# Patient Record
Sex: Male | Born: 1935 | Race: White | Hispanic: No | Marital: Married | Smoking: Former smoker
Health system: Southern US, Community
[De-identification: ages and names within clinical notes are randomized; demographics above are authoritative.]

## PROBLEM LIST (undated history)

## (undated) DIAGNOSIS — F329 Major depressive disorder, single episode, unspecified: Secondary | ICD-10-CM

## (undated) DIAGNOSIS — F32A Depression, unspecified: Secondary | ICD-10-CM

## (undated) DIAGNOSIS — M4800 Spinal stenosis, site unspecified: Secondary | ICD-10-CM

## (undated) DIAGNOSIS — I10 Essential (primary) hypertension: Secondary | ICD-10-CM

## (undated) DIAGNOSIS — C189 Malignant neoplasm of colon, unspecified: Secondary | ICD-10-CM

## (undated) DIAGNOSIS — K219 Gastro-esophageal reflux disease without esophagitis: Secondary | ICD-10-CM

## (undated) HISTORY — PX: TONSILLECTOMY: SUR1361

## (undated) HISTORY — PX: CHOLECYSTECTOMY: SHX55

---

## 2016-01-04 ENCOUNTER — Encounter (HOSPITAL_COMMUNITY): Payer: Self-pay | Admitting: *Deleted

## 2016-01-04 ENCOUNTER — Emergency Department (HOSPITAL_COMMUNITY)
Admission: EM | Admit: 2016-01-04 | Discharge: 2016-01-04 | Disposition: A | Discharge status: Home / Self Care | Payer: Medicare Other | Attending: Emergency Medicine | Admitting: Emergency Medicine

## 2016-01-04 DIAGNOSIS — Y939 Activity, unspecified: Secondary | ICD-10-CM | POA: Insufficient documentation

## 2016-01-04 DIAGNOSIS — S3992XA Unspecified injury of lower back, initial encounter: Secondary | ICD-10-CM | POA: Diagnosis present

## 2016-01-04 DIAGNOSIS — Z87891 Personal history of nicotine dependence: Secondary | ICD-10-CM | POA: Insufficient documentation

## 2016-01-04 DIAGNOSIS — W19XXXA Unspecified fall, initial encounter: Secondary | ICD-10-CM

## 2016-01-04 DIAGNOSIS — Y9289 Other specified places as the place of occurrence of the external cause: Secondary | ICD-10-CM | POA: Insufficient documentation

## 2016-01-04 DIAGNOSIS — I10 Essential (primary) hypertension: Secondary | ICD-10-CM | POA: Insufficient documentation

## 2016-01-04 DIAGNOSIS — S300XXA Contusion of lower back and pelvis, initial encounter: Secondary | ICD-10-CM | POA: Insufficient documentation

## 2016-01-04 DIAGNOSIS — Z79899 Other long term (current) drug therapy: Secondary | ICD-10-CM | POA: Insufficient documentation

## 2016-01-04 DIAGNOSIS — Y999 Unspecified external cause status: Secondary | ICD-10-CM | POA: Diagnosis not present

## 2016-01-04 DIAGNOSIS — W06XXXA Fall from bed, initial encounter: Secondary | ICD-10-CM | POA: Diagnosis not present

## 2016-01-04 DIAGNOSIS — Z85038 Personal history of other malignant neoplasm of large intestine: Secondary | ICD-10-CM | POA: Diagnosis not present

## 2016-01-04 HISTORY — DX: Essential (primary) hypertension: I10

## 2016-01-04 HISTORY — DX: Depression, unspecified: F32.A

## 2016-01-04 HISTORY — DX: Major depressive disorder, single episode, unspecified: F32.9

## 2016-01-04 HISTORY — DX: Malignant neoplasm of colon, unspecified: C18.9

## 2016-01-04 HISTORY — DX: Gastro-esophageal reflux disease without esophagitis: K21.9

## 2016-01-04 HISTORY — DX: Spinal stenosis, site unspecified: M48.00

## 2016-01-04 MED ORDER — ACETAMINOPHEN 500 MG PO TABS: 500.0000 mg | ORAL_TABLET | Freq: Four times a day (QID) | ORAL | Status: DC | PRN

## 2016-01-04 NOTE — ED Provider Notes (Addendum)
CSN: [id number]     Arrival date & time 01/04/16  [id number] History   First MD Initiated Contact with Patient 01/04/16 [phone]     Chief Complaint  Patient presents with  . Fall     (Consider location/radiation/quality/duration/timing/severity/associated sxs/prior Treatment) Patient is a 82 y.o. male presenting with fall. The history is provided by the patient.  Fall This is a new (pt slipped off the side of the bed today landing on the floor on his right side) problem. The current episode started 1 to 2 hours ago. The problem occurs constantly. The problem has been resolved. Associated symptoms comments: Minimal right sided back and buttocks pain. Nothing aggravates the symptoms. Nothing relieves the symptoms. He has tried nothing for the symptoms. The treatment provided significant relief.    Past Medical History  Diagnosis Date  . Hypertension   . GERD (gastroesophageal reflux disease)   . Depressed   . Colon cancer ([place])   . Spinal stenosis    Past Surgical History  Procedure Laterality Date  . Cholecystectomy    . Tonsillectomy     History reviewed. No pertinent family history. Social History  Substance Use Topics  . Smoking status: Former Research scientist (life sciences)  . Smokeless tobacco: None  . Alcohol Use: No    Review of Systems  All other systems reviewed and are negative.     Allergies  Review of patient's allergies indicates no known allergies.  Home Medications   Prior to Admission medications   Medication Sig Start Date End Date Taking? Authorizing Provider  albuterol (PROVENTIL) (2.5 MG/3ML) 0.083% nebulizer solution Take 2.5 mg by nebulization every 6 (six) hours as needed for wheezing or shortness of breath.   Yes Historical Provider, MD  albuterol (PROVENTIL) (5 MG/ML) 0.5% nebulizer solution Take 2.5 mg by nebulization every 6 (six) hours as needed for wheezing or shortness of breath.   Yes Historical Provider, MD  lisinopril-hydrochlorothiazide (PRINZIDE,ZESTORETIC) 20-12.5  MG tablet Take 1 tablet by mouth daily.   Yes Historical Provider, MD  omeprazole (PRILOSEC) 20 MG capsule Take 20 mg by mouth daily.   Yes Historical Provider, MD   There were no vitals taken for this visit. Physical Exam  Constitutional: He is oriented to person, place, and time. He appears well-developed and well-nourished. No distress.  HENT:  Head: Normocephalic and atraumatic.  Mouth/Throat: Oropharynx is clear and moist.  Eyes: Conjunctivae and EOM are normal. Pupils are equal, round, and reactive to light.  Neck: Normal range of motion. Neck supple. No spinous process tenderness and no muscular tenderness present.  Cardiovascular: Normal rate, regular rhythm and intact distal pulses.   No murmur heard. Pulmonary/Chest: Effort normal and breath sounds normal. No respiratory distress. He has no wheezes. He has no rales.  Abdominal: Soft. He exhibits no distension. There is no tenderness. There is no rebound and no guarding.  Musculoskeletal: Normal range of motion. He exhibits tenderness. He exhibits no edema.       Back:  Neurological: He is alert and oriented to person, place, and time. He has normal strength. No sensory deficit. Gait normal.  Able to walk independently without difficulty  Skin: Skin is warm and dry. No rash noted. No erythema.  Psychiatric: He has a normal mood and affect. His behavior is normal.  Nursing note and vitals reviewed.   ED Course  Procedures (including critical care time) Labs Review Labs Reviewed - No data to display  Imaging Review No results found. I have personally reviewed and evaluated  these images and lab results as part of my medical decision-making.   EKG Interpretation None      MDM   Final diagnoses:  Fall, initial encounter  Contusion of buttock, initial encounter    Patient is a 82 year old male presenting from a nursing home after a fall today. Patient states he slipped out of the bed and landed on his right side. He  states he has mild right back pain but no other complaints. He is able to ambulate in the room without difficulty. No reproducible pain. He denies any head injury or LOC. He is not on anticoagulation. I'm no need for further evaluation. Patient was discharged back to the nursing facility.    Blanchie Dessert, MD 01/04/16 [place], MD 01/04/16 1008

## 2016-01-04 NOTE — Discharge Instructions (Signed)

## 2016-01-04 NOTE — ED Notes (Signed)
[facility] called for transport to [facility]

## 2016-01-04 NOTE — ED Notes (Signed)
Patient is resting comfortably. 

## 2016-01-04 NOTE — ED Notes (Addendum)
Brought in by ems from [facility] for fall x  Days ago ,denies complaints, chronic back and  hip pain

## 2016-01-04 NOTE — ED Notes (Signed)
Bed: WA02 Expected date: 01/04/16 Expected time: 9:47 AM Means of arrival:  Comments: Fall from nsg home, denies injury

## 2016-03-14 ENCOUNTER — Emergency Department (HOSPITAL_COMMUNITY): Payer: Medicare Other

## 2016-03-14 ENCOUNTER — Encounter (HOSPITAL_COMMUNITY): Payer: Self-pay | Admitting: *Deleted

## 2016-03-14 ENCOUNTER — Emergency Department (HOSPITAL_COMMUNITY)
Admission: EM | Admit: 2016-03-14 | Discharge: 2016-03-14 | Disposition: A | Discharge status: Home / Self Care | Payer: Medicare Other | Attending: Emergency Medicine | Admitting: Emergency Medicine

## 2016-03-14 DIAGNOSIS — Y999 Unspecified external cause status: Secondary | ICD-10-CM | POA: Diagnosis not present

## 2016-03-14 DIAGNOSIS — Z79899 Other long term (current) drug therapy: Secondary | ICD-10-CM | POA: Diagnosis not present

## 2016-03-14 DIAGNOSIS — W19XXXA Unspecified fall, initial encounter: Secondary | ICD-10-CM | POA: Diagnosis not present

## 2016-03-14 DIAGNOSIS — Z87891 Personal history of nicotine dependence: Secondary | ICD-10-CM | POA: Diagnosis not present

## 2016-03-14 DIAGNOSIS — Y939 Activity, unspecified: Secondary | ICD-10-CM | POA: Diagnosis not present

## 2016-03-14 DIAGNOSIS — Y929 Unspecified place or not applicable: Secondary | ICD-10-CM | POA: Insufficient documentation

## 2016-03-14 DIAGNOSIS — I10 Essential (primary) hypertension: Secondary | ICD-10-CM | POA: Insufficient documentation

## 2016-03-14 DIAGNOSIS — M542 Cervicalgia: Secondary | ICD-10-CM | POA: Insufficient documentation

## 2016-03-14 MED ORDER — ACETAMINOPHEN 500 MG PO TABS: 500.0000 mg | ORAL_TABLET | Freq: Four times a day (QID) | ORAL | 0 refills | Status: AC | PRN

## 2016-03-14 MED ORDER — ACETAMINOPHEN 500 MG PO TABS
1000.0000 mg | ORAL_TABLET | Freq: Once | ORAL | Status: AC
  Administered 2016-03-14: 1000 mg via ORAL
  Filled 2016-03-14: qty 2

## 2016-03-14 NOTE — ED Provider Notes (Signed)
[facility] DEPT Provider Note   CSN: [id number] Arrival date & time: 03/14/16  1836  First Provider Contact:  First MD Initiated Contact with Patient 03/14/16 1850        History   Chief Complaint Chief Complaint  Patient presents with  . Neck Pain    HPI Franklin Foster is a 82 y.o. male.  Patient unable to describe any onset or modifying factors of symptoms. He states only that he has neck pain and that is why he is here. He is a very poor historian.  Level 5 exception to history: dementia    The history is provided by the patient.    Past Medical History:  Diagnosis Date  . Colon cancer ([place])   . Depressed   . GERD (gastroesophageal reflux disease)   . Hypertension   . Spinal stenosis     There are no active problems to display for this patient.   Past Surgical History:  Procedure Laterality Date  . CHOLECYSTECTOMY    . TONSILLECTOMY         Home Medications    Prior to Admission medications   Medication Sig Start Date End Date Taking? Authorizing Provider  acetaminophen (TYLENOL) 500 MG tablet Take 1 tablet (500 mg total) by mouth every 6 (six) hours as needed for moderate pain. 01/04/16  Yes Blanchie Dessert, MD  albuterol (PROVENTIL HFA;VENTOLIN HFA) 108 (90 Base) MCG/ACT inhaler Inhale 1 puff into the lungs every 4 (four) hours as needed for wheezing or shortness of breath.   Yes Historical Provider, MD  ipratropium-albuterol (DUONEB) 0.5-2.5 (3) MG/3ML SOLN Take 3 mLs by nebulization 2 (two) times daily as needed (wheezing/shortness of breath).   Yes Historical Provider, MD  lisinopril-hydrochlorothiazide (PRINZIDE,ZESTORETIC) 20-12.5 MG tablet Take 1 tablet by mouth daily.   Yes Historical Provider, MD  Multiple Vitamins-Minerals (THEREMS-M PO) Take 1 tablet by mouth daily.   Yes Historical Provider, MD  omeprazole (PRILOSEC) 20 MG capsule Take 20 mg by mouth daily.   Yes Historical Provider, MD  Tetrahydrozoline HCl (EYE DROPS OP) Apply 1 drop to eye  4 (four) times daily. "Artificial Saliva Solution"   Yes Historical Provider, MD    Family History No family history on file.  Social History Social History  Substance Use Topics  . Smoking status: Former Research scientist (life sciences)  . Smokeless tobacco: Never Used  . Alcohol use No     Allergies   Review of patient's allergies indicates no known allergies.   Review of Systems Review of Systems  Unable to perform ROS: Age     Physical Exam Updated Vital Signs BP 151/59 (BP Location: Right Arm)   Pulse 78   Temp 99.1 F (37.3 C) (Oral)   Resp 16   SpO2 98%   Physical Exam  Constitutional: He appears well-developed and well-nourished. No distress.  HENT:  Head: Normocephalic and atraumatic.  Eyes: Conjunctivae are normal.  Neck: Trachea normal. Neck supple. Muscular tenderness (of right side of neck) present. No spinous process tenderness present. No neck rigidity. No tracheal deviation and normal range of motion present.    Cardiovascular: Normal rate and regular rhythm.   Pulmonary/Chest: Effort normal. No respiratory distress.  Abdominal: Soft. He exhibits no distension.  Neurological: He is alert. He is disoriented (pleasantly).  Skin: Skin is warm and dry.  Psychiatric: He has a normal mood and affect.     ED Treatments / Results  Labs (all labs ordered are listed, but only abnormal results are displayed) Labs Reviewed -  No data to display  EKG  EKG Interpretation None       Radiology Dg Cervical Spine Complete  Result Date: 03/14/2016 CLINICAL DATA:  82 year old post fall yesterday. Right-sided and posterior cervical neck pain since fall. EXAM: CERVICAL SPINE - COMPLETE 4+ VIEW COMPARISON:  None. FINDINGS: No prior exams available for comparison. There is reversal and straightening of normal lordosis. No evidence of listhesis. Disc space narrowing throughout cervical spine with endplate spurring, most prominent C4 through C7. Multilevel facet arthropathy. Lateral  masses of C1 are well aligned on C2. No radiographic evidence of fracture. No prevertebral soft tissue edema. IMPRESSION: Multilevel degenerative disc disease and facet arthropathy with reversal of normal cervical lordosis. No radiographic evidence of acute fracture or subluxation. If there is persistent clinical concern for fracture, recommend CT. Electronically Signed   By: Jeb Levering M.D.   On: 03/14/2016 19:55    Procedures Procedures (including critical care time)  Medications Ordered in ED Medications - No data to display   Initial Impression / Assessment and Plan / ED Course  I have reviewed the triage vital signs and the nursing notes.  Pertinent labs & imaging results that were available during my care of the patient were reviewed by me and considered in my medical decision making (see chart for details).  Clinical Course    82 year old male presents with neck pain. He is a poor historian and the report from the nursing facility is that he may have tooth pain. The patient is unsure and thinks he might of falling yesterday but he is unable to tell me the year or the month or what city he is in. He has no evidence of trauma to the scalp. Minimal tenderness in right side of the neck. Screening x-ray for bony injury to the neck was performed and is negative. The patient is moving everything appropriately and has full sensation throughout his upper extremities and lower extremities. He is given Tylenol for his pain with improvement. Plan for follow-up with primary care physician and return precautions for new or concerning symptoms.  Final Clinical Impressions(s) / ED Diagnoses   Final diagnoses:  Neck pain    New Prescriptions New Prescriptions   No medications on file     [name], MD 03/14/16 2038

## 2016-03-14 NOTE — ED Triage Notes (Signed)
Per EMS, pt complains of right sided neck pain for the past 2 days. Pt denies injury/fall. Pt states the pain is on the same side as a rotten tooth.   Pt is from [facility].

## 2016-03-14 NOTE — ED Notes (Signed)
Bed: [facility] Expected date:  Expected time:  Means of arrival:  Comments: 

## 2016-03-14 NOTE — ED Notes (Signed)
No respiratory or acute distress noted alert and talking urinal given for pt to use.

## 2016-03-14 NOTE — ED Notes (Signed)
Called dispatch and patient is still on the list. Waiting on PTAR.

## 2016-11-27 ENCOUNTER — Encounter (HOSPITAL_COMMUNITY): Payer: Self-pay | Admitting: Radiology

## 2016-11-27 ENCOUNTER — Emergency Department (HOSPITAL_COMMUNITY): Payer: Medicare Other

## 2016-11-27 ENCOUNTER — Observation Stay (HOSPITAL_COMMUNITY)
Admission: EM | Admit: 2016-11-27 | Discharge: 2016-11-29 | Disposition: A | Discharge status: Nursing Home (Includes ICF) | Payer: Medicare Other | Attending: Family Medicine | Admitting: Family Medicine

## 2016-11-27 DIAGNOSIS — R270 Ataxia, unspecified: Secondary | ICD-10-CM

## 2016-11-27 DIAGNOSIS — Y9301 Activity, walking, marching and hiking: Secondary | ICD-10-CM | POA: Diagnosis not present

## 2016-11-27 DIAGNOSIS — E860 Dehydration: Secondary | ICD-10-CM | POA: Diagnosis present

## 2016-11-27 DIAGNOSIS — R262 Difficulty in walking, not elsewhere classified: Secondary | ICD-10-CM

## 2016-11-27 DIAGNOSIS — I10 Essential (primary) hypertension: Secondary | ICD-10-CM | POA: Diagnosis not present

## 2016-11-27 DIAGNOSIS — Z85038 Personal history of other malignant neoplasm of large intestine: Secondary | ICD-10-CM | POA: Insufficient documentation

## 2016-11-27 DIAGNOSIS — F0390 Unspecified dementia without behavioral disturbance: Secondary | ICD-10-CM | POA: Diagnosis not present

## 2016-11-27 DIAGNOSIS — S0003XA Contusion of scalp, initial encounter: Secondary | ICD-10-CM | POA: Diagnosis not present

## 2016-11-27 DIAGNOSIS — R55 Syncope and collapse: Principal | ICD-10-CM | POA: Diagnosis present

## 2016-11-27 DIAGNOSIS — S4992XA Unspecified injury of left shoulder and upper arm, initial encounter: Secondary | ICD-10-CM | POA: Diagnosis present

## 2016-11-27 DIAGNOSIS — M25511 Pain in right shoulder: Secondary | ICD-10-CM | POA: Diagnosis not present

## 2016-11-27 DIAGNOSIS — W1830XA Fall on same level, unspecified, initial encounter: Secondary | ICD-10-CM | POA: Diagnosis not present

## 2016-11-27 DIAGNOSIS — Z7951 Long term (current) use of inhaled steroids: Secondary | ICD-10-CM | POA: Insufficient documentation

## 2016-11-27 DIAGNOSIS — M25512 Pain in left shoulder: Secondary | ICD-10-CM | POA: Diagnosis not present

## 2016-11-27 DIAGNOSIS — K219 Gastro-esophageal reflux disease without esophagitis: Secondary | ICD-10-CM | POA: Diagnosis not present

## 2016-11-27 DIAGNOSIS — Z79899 Other long term (current) drug therapy: Secondary | ICD-10-CM | POA: Insufficient documentation

## 2016-11-27 DIAGNOSIS — Y92099 Unspecified place in other non-institutional residence as the place of occurrence of the external cause: Secondary | ICD-10-CM | POA: Insufficient documentation

## 2016-11-27 DIAGNOSIS — G459 Transient cerebral ischemic attack, unspecified: Secondary | ICD-10-CM

## 2016-11-27 DIAGNOSIS — R1311 Dysphagia, oral phase: Secondary | ICD-10-CM | POA: Diagnosis not present

## 2016-11-27 DIAGNOSIS — R4781 Slurred speech: Secondary | ICD-10-CM | POA: Insufficient documentation

## 2016-11-27 DIAGNOSIS — Z87891 Personal history of nicotine dependence: Secondary | ICD-10-CM | POA: Insufficient documentation

## 2016-11-27 LAB — BASIC METABOLIC PANEL
ANION GAP: 8 (ref 5–15)
BUN: 20 mg/dL (ref 6–20)
CALCIUM: 8.8 mg/dL — AB (ref 8.9–10.3)
CO2: 25 mmol/L (ref 22–32)
Chloride: 99 mmol/L — ABNORMAL LOW (ref 101–111)
Creatinine, Ser: 0.97 mg/dL (ref 0.61–1.24)
GFR calc Af Amer: >60 mL/min (ref >60)
Glucose, Bld: 102 mg/dL — ABNORMAL HIGH (ref 65–99)
POTASSIUM: 4.1 mmol/L (ref 3.5–5.1)
SODIUM: 132 mmol/L — AB (ref 135–145)

## 2016-11-27 LAB — URINALYSIS, ROUTINE W REFLEX MICROSCOPIC
BILIRUBIN URINE: NEGATIVE
Glucose, UA: NEGATIVE mg/dL
Hgb urine dipstick: NEGATIVE
KETONES UR: NEGATIVE mg/dL
LEUKOCYTES UA: NEGATIVE
NITRITE: NEGATIVE
PH: 5 (ref 5.0–8.0)
Protein, ur: NEGATIVE mg/dL
Specific Gravity, Urine: 1.01 (ref 1.005–1.030)

## 2016-11-27 LAB — CBC
HEMATOCRIT: 35.8 % — AB (ref 39.0–52.0)
HEMOGLOBIN: 12.3 g/dL — AB (ref 13.0–17.0)
MCH: 33.1 pg (ref 26.0–34.0)
MCHC: 34.4 g/dL (ref 30.0–36.0)
MCV: 96.2 fL (ref 78.0–100.0)
Platelets: 219 10*3/uL (ref 150–400)
RBC: 3.72 MIL/uL — ABNORMAL LOW (ref 4.22–5.81)
RDW: 14.6 % (ref 11.5–15.5)
WBC: 7.4 10*3/uL (ref 4.0–10.5)

## 2016-11-27 LAB — CBG MONITORING, ED: GLUCOSE-CAPILLARY: 120 mg/dL — AB (ref 65–99)

## 2016-11-27 LAB — TROPONIN I: Troponin I: <0.03 ng/mL (ref <0.03)

## 2016-11-27 MED ORDER — IPRATROPIUM-ALBUTEROL 0.5-2.5 (3) MG/3ML IN SOLN
3.0000 mL | Freq: Once | RESPIRATORY_TRACT | Status: AC
  Administered 2016-11-27: 3 mL via RESPIRATORY_TRACT
  Filled 2016-11-27: qty 3

## 2016-11-27 MED ORDER — SODIUM CHLORIDE 0.9 % IV SOLN
Freq: Once | INTRAVENOUS | Status: AC
  Administered 2016-11-27: 22:00:00 via INTRAVENOUS

## 2016-11-27 MED ORDER — SODIUM CHLORIDE 0.9 % IV BOLUS (SEPSIS)
500.0000 mL | Freq: Once | INTRAVENOUS | Status: AC
  Administered 2016-11-27: 500 mL via INTRAVENOUS

## 2016-11-27 MED ORDER — SODIUM CHLORIDE 0.9 % IV BOLUS (SEPSIS): 500.0000 mL | Freq: Once | INTRAVENOUS | Status: DC

## 2016-11-27 NOTE — ED Notes (Addendum)
Patient transported to CT 

## 2016-11-27 NOTE — ED Notes (Signed)
ED Provider at bedside. 

## 2016-11-27 NOTE — ED Triage Notes (Signed)
Per [facility]- Pt resides at [facility]. FULL CODE. Witnessed fall. ? Dizziness. No LOC. Denies neck and back pain however BIL shoulder pain. [facility] cleared. Contusion to occipital area. No other trauma noted. Amu bates with walker at baseline. Pt states he felt "shaky" however unsure if this is his baseline.Pt has HX of slurred speech. Staff states baseline.

## 2016-11-27 NOTE — ED Notes (Signed)
EKG PERFORMED.

## 2016-11-27 NOTE — ED Provider Notes (Signed)
[facility] DEPT Provider Note   CSN: [id number] Arrival date & time: 11/27/16  1339     History   Chief Complaint Chief Complaint  Patient presents with  . Fall  . Shoulder Injury  . Dizziness    HPI Franklin Foster is a 83 y.o. male.  HPI   83 yo M with PMHx colon CA, HTN, dementia here with fall. Pt reportedly was at his baseline state of health until this afternoon. Pt reportedly stood up then immediately fell backwards, hitting his head and back. He did not lose consciousness. Was unable to get up until EMS arrival. With EMS, pt reported that he was "very wobbly" when they stood him up but was able to walk to stretcher with assistance. No blood thinner use. He currently c/o mild aching b/l shoulder pain that is aching and throbbing. No alleviating factors. No headache. No vision changes. No abdominal pain, nausea, vomiting.   Level 5 caveat invoked as remainder of history, ROS, and physical exam limited due to patient's dementia  Past Medical History:  Diagnosis Date  . Colon cancer ([place])   . Depressed   . GERD (gastroesophageal reflux disease)   . Hypertension   . Spinal stenosis     Patient Active Problem List   Diagnosis Date Noted  . Syncope 11/27/2016  . Dehydration 11/27/2016  . Essential hypertension 11/27/2016  . Ataxia 11/27/2016    Past Surgical History:  Procedure Laterality Date  . CHOLECYSTECTOMY    . TONSILLECTOMY         Home Medications    Prior to Admission medications   Medication Sig Start Date End Date Taking? Authorizing Provider  ipratropium-albuterol (DUONEB) 0.5-2.5 (3) MG/3ML SOLN Take 3 mLs by nebulization 2 (two) times daily.    Yes Historical Provider, MD  lisinopril-hydrochlorothiazide (PRINZIDE,ZESTORETIC) 10-12.5 MG tablet Take 1.5 tablets by mouth daily.   Yes Historical Provider, MD  Multiple Vitamins-Minerals (THEREMS-M PO) Take 1 tablet by mouth daily.   Yes Historical Provider, MD  omeprazole (PRILOSEC) 20 MG capsule  Take 20 mg by mouth daily.   Yes Historical Provider, MD  Tetrahydrozoline HCl (EYE DROPS OP) Apply 1 drop to eye 4 (four) times daily. "Artificial Saliva Solution"   Yes Historical Provider, MD  acetaminophen (TYLENOL) 500 MG tablet Take 1 tablet (500 mg total) by mouth every 6 (six) hours as needed for moderate pain. 03/14/16   [name], MD  albuterol (PROVENTIL HFA;VENTOLIN HFA) 108 (90 Base) MCG/ACT inhaler Inhale 1 puff into the lungs every 4 (four) hours as needed for wheezing or shortness of breath.    Historical Provider, MD    Family History Family History  Problem Relation Age of Onset  . Ovarian cancer Sister   . CAD Neg Hx     Social History Social History  Substance Use Topics  . Smoking status: Former Research scientist (life sciences)  . Smokeless tobacco: Never Used  . Alcohol use No     Allergies   Patient has no known allergies.   Review of Systems Review of Systems  Unable to perform ROS: Dementia  Constitutional: Positive for fatigue. Negative for chills and fever.  HENT: Negative for congestion and rhinorrhea.   Eyes: Negative for visual disturbance.  Respiratory: Negative for cough, shortness of breath and wheezing.   Cardiovascular: Negative for chest pain and leg swelling.  Gastrointestinal: Negative for abdominal pain, diarrhea, nausea and vomiting.  Genitourinary: Negative for dysuria and flank pain.  Musculoskeletal: Positive for gait problem. Negative for neck pain  and neck stiffness.  Skin: Positive for wound. Negative for rash.  Allergic/Immunologic: Negative for immunocompromised state.  Neurological: Negative for syncope, weakness and headaches.  All other systems reviewed and are negative.    Physical Exam Updated Vital Signs BP 131/70 (BP Location: Right Arm)   Pulse 63   Temp 98 F (36.7 C) (Oral)   Resp 18   Ht 6' (1.829 m)   Wt 157 lb 12.8 oz (71.6 kg)   SpO2 100%   BMI 21.40 kg/m   Physical Exam  Constitutional: He appears well-developed and  well-nourished. No distress.  HENT:  Head: Normocephalic and atraumatic.  Eyes: Conjunctivae are normal.  Neck: Neck supple.  Moderate midline and paraspinal lower cervical and upper thoracic spine pain. No deformity or bruising.  Cardiovascular: Normal rate, regular rhythm and normal heart sounds.  Exam reveals no friction rub.   No murmur heard. Pulmonary/Chest: Effort normal and breath sounds normal. No respiratory distress. He has no wheezes. He has no rales.  Abdominal: He exhibits no distension.  Musculoskeletal: He exhibits no edema.  Neurological: He is alert. He exhibits normal muscle tone.  Oriented to person only. MAE with 5/5 strength. Follows commands. No gross tremor. Gait deferred. Speech slurred but fluent and coherent. Face is symmetric. Normal FTN.   Skin: Skin is warm. Capillary refill takes less than 2 seconds.  Psychiatric: He has a normal mood and affect.  Nursing note and vitals reviewed.    ED Treatments / Results  Labs (all labs ordered are listed, but only abnormal results are displayed) Labs Reviewed  BASIC METABOLIC PANEL - Abnormal; Notable for the following:       Result Value   Sodium 132 (*)    Chloride 99 (*)    Glucose, Bld 102 (*)    Calcium 8.8 (*)    All other components within normal limits  CBC - Abnormal; Notable for the following:    RBC 3.72 (*)    Hemoglobin 12.3 (*)    HCT 35.8 (*)    All other components within normal limits  CBG MONITORING, ED - Abnormal; Notable for the following:    Glucose-Capillary 120 (*)    All other components within normal limits  MRSA PCR SCREENING  URINALYSIS, ROUTINE W REFLEX MICROSCOPIC  TROPONIN I  TROPONIN I  HEMOGLOBIN A1C  LIPID PANEL    EKG  EKG Interpretation  Date/Time:  Friday November 27 2016 14:22:17 EDT Ventricular Rate:  62 PR Interval:    QRS Duration: 99 QT Interval:  415 QTC Calculation: 422 R Axis:   51 Text Interpretation:  Sinus or ectopic atrial rhythm Ventricular  premature complex Prolonged PR interval Abnormal R-wave progression, early transition No old tracing to compare Confirmed by Kyaire Gruenewald MD, Vernestine Brodhead [id number]) on 11/27/2016 2:33:37 PM       Radiology Dg Chest 2 View  Result Date: 11/27/2016 CLINICAL DATA:  Fall with back and shoulder pain. Initial encounter. EXAM: CHEST  2 VIEW COMPARISON:  None. FINDINGS: The cardiomediastinal silhouette is unremarkable. Mild interstitial prominence noted. There is no evidence of focal airspace disease, pulmonary edema, suspicious pulmonary nodule/mass, pleural effusion, or pneumothorax. No acute bony abnormalities are identified. IMPRESSION: No active cardiopulmonary disease. Electronically Signed   By: Margarette Canada M.D.   On: 11/27/2016 15:40   Dg Shoulder Right  Result Date: 11/27/2016 CLINICAL DATA:  Acute right shoulder pain following fall today. Initial encounter. EXAM: RIGHT SHOULDER - 2+ VIEW COMPARISON:  None. FINDINGS: There is no evidence  of fracture or dislocation. There is no evidence of arthropathy or other focal bone abnormality. Soft tissues are unremarkable. IMPRESSION: Negative. Electronically Signed   By: Margarette Canada M.D.   On: 11/27/2016 15:41   Ct Head Wo Contrast  Result Date: 11/27/2016 CLINICAL DATA:  83 year old male with head and neck injury following fall 2 days ago. Initial encounter. EXAM: CT HEAD WITHOUT CONTRAST CT CERVICAL SPINE WITHOUT CONTRAST TECHNIQUE: Multidetector CT imaging of the head and cervical spine was performed following the standard protocol without intravenous contrast. Multiplanar CT image reconstructions of the cervical spine were also generated. COMPARISON:  03/14/2016 cervical spine radiographs FINDINGS: CT HEAD FINDINGS Brain: No evidence of acute infarction, hemorrhage, hydrocephalus, extra-axial collection or mass lesion/mass effect. Atrophy, chronic small-vessel white matter ischemic changes and remote bilateral basal ganglia infarcts noted. Vascular: Intracranial  atherosclerotic calcifications noted. Skull: No acute abnormality.  Right frontoparietal craniotomy noted. Sinuses/Orbits: Opacified right maxillary sinus noted. No other significant abnormalities. Other: Posterior scalp soft tissue swelling identified. CT CERVICAL SPINE FINDINGS Alignment: Reversal of the normal cervical lordosis noted. Skull base and vertebrae: No acute fracture. No primary bone lesion or focal pathologic process. Soft tissues and spinal canal: No prevertebral fluid or swelling. No visible canal hematoma. Disc levels: Mild to moderate multilevel degenerative disc disease, spondylosis and facet arthropathy throughout the cervical spine noted, greatest from C3-C7. Upper chest: No acute abnormality Other: None IMPRESSION: No evidence of acute intracranial abnormality. Atrophy, chronic small-vessel white matter ischemic changes and remote basal ganglia infarcts. Posterior scalp soft tissue swelling without fracture. No static evidence of acute injury to the cervical spine. Degenerative changes as described. Electronically Signed   By: Margarette Canada M.D.   On: 11/27/2016 15:53   Ct Cervical Spine Wo Contrast  Result Date: 11/27/2016 CLINICAL DATA:  83 year old male with head and neck injury following fall 2 days ago. Initial encounter. EXAM: CT HEAD WITHOUT CONTRAST CT CERVICAL SPINE WITHOUT CONTRAST TECHNIQUE: Multidetector CT imaging of the head and cervical spine was performed following the standard protocol without intravenous contrast. Multiplanar CT image reconstructions of the cervical spine were also generated. COMPARISON:  03/14/2016 cervical spine radiographs FINDINGS: CT HEAD FINDINGS Brain: No evidence of acute infarction, hemorrhage, hydrocephalus, extra-axial collection or mass lesion/mass effect. Atrophy, chronic small-vessel white matter ischemic changes and remote bilateral basal ganglia infarcts noted. Vascular: Intracranial atherosclerotic calcifications noted. Skull: No acute  abnormality.  Right frontoparietal craniotomy noted. Sinuses/Orbits: Opacified right maxillary sinus noted. No other significant abnormalities. Other: Posterior scalp soft tissue swelling identified. CT CERVICAL SPINE FINDINGS Alignment: Reversal of the normal cervical lordosis noted. Skull base and vertebrae: No acute fracture. No primary bone lesion or focal pathologic process. Soft tissues and spinal canal: No prevertebral fluid or swelling. No visible canal hematoma. Disc levels: Mild to moderate multilevel degenerative disc disease, spondylosis and facet arthropathy throughout the cervical spine noted, greatest from C3-C7. Upper chest: No acute abnormality Other: None IMPRESSION: No evidence of acute intracranial abnormality. Atrophy, chronic small-vessel white matter ischemic changes and remote basal ganglia infarcts. Posterior scalp soft tissue swelling without fracture. No static evidence of acute injury to the cervical spine. Degenerative changes as described. Electronically Signed   By: Margarette Canada M.D.   On: 11/27/2016 15:53   Dg Shoulder Left  Result Date: 11/27/2016 CLINICAL DATA:  Acute left shoulder pain following fall today. Initial encounter. EXAM: LEFT SHOULDER - 2+ VIEW COMPARISON:  None. FINDINGS: There is no evidence of fracture or dislocation. There is no evidence of  arthropathy or other focal bone abnormality. Soft tissues are unremarkable. IMPRESSION: Negative. Electronically Signed   By: Margarette Canada M.D.   On: 11/27/2016 15:42    Procedures Procedures (including critical care time)  Medications Ordered in ED Medications  sodium chloride 0.9 % bolus 500 mL (not administered)  ipratropium-albuterol (DUONEB) 0.5-2.5 (3) MG/3ML nebulizer solution 3 mL (3 mLs Nebulization Not Given 11/28/16 0031)  tetrahydrozoline 0.05 % ophthalmic solution 1 drop (not administered)  pantoprazole (PROTONIX) EC tablet 40 mg (not administered)   stroke: mapping our early stages of recovery book (not  administered)  0.9 %  sodium chloride infusion ( Intravenous New Bag/Given 11/28/16 0010)  acetaminophen (TYLENOL) tablet 650 mg (650 mg Oral Given 11/28/16 0036)    Or  acetaminophen (TYLENOL) solution 650 mg ( Per Tube See Alternative 11/28/16 0036)    Or  acetaminophen (TYLENOL) suppository 650 mg ( Rectal See Alternative 11/28/16 0036)  senna-docusate (Senokot-S) tablet 1 tablet (not administered)  aspirin suppository 300 mg (not administered)    Or  aspirin tablet 325 mg (not administered)  sodium chloride 0.9 % bolus 500 mL (0 mLs Intravenous Stopped 11/27/16 1923)  ipratropium-albuterol (DUONEB) 0.5-2.5 (3) MG/3ML nebulizer solution 3 mL (3 mLs Nebulization Given 11/27/16 2008)  0.9 %  sodium chloride infusion ( Intravenous New Bag/Given 11/27/16 2143)     Initial Impression / Assessment and Plan / ED Course  I have reviewed the triage vital signs and the nursing notes.  Pertinent labs & imaging results that were available during my care of the patient were reviewed by me and considered in my medical decision making (see chart for details).     83 yo F with PMHx as above here with dizziness, syncopal episode and back pain. VSS, exam as above. Pt has slurred speech but this is reportedly baseline, no known last normal. Lab work overlal reassuring but pt mildly orthostatic here. Despite IVF, pt remains significantly ataxic and unable to ambualte on his own. While this may be 2/2 ongoing orthostasis, must also consider small CVA given his known h/o same with slurred speech. Will need admission for continued hydration, possible MRI.  Final Clinical Impressions(s) / ED Diagnoses   Final diagnoses:  Dehydration  Unable to walk    New Prescriptions Current Discharge Medication List       Duffy Bruce, MD 11/28/16 [phone]

## 2016-11-27 NOTE — ED Notes (Signed)
Bed: [facility]57 Expected date:  Expected time:  Means of arrival: Ambulance Comments: EMS 89 fall/head injury

## 2016-11-27 NOTE — ED Notes (Signed)
ATTEMPTED TO AMBULATE PT. PT IS UNABLE TO STAND INDEPENDENTLY. PT STATES HE REMAINS DIZZY WITNESSED BY ADMISSION RN. EDP ISSACS MADE AWARE OF PT CURRENT STATUS

## 2016-11-27 NOTE — ED Notes (Signed)
RN GETTING LABS

## 2016-11-27 NOTE — H&P (Addendum)
Franklin Foster HMC:[id number] DOB: Nov 22, 1919 DOA: 11/27/2016     PCP: Pcp Not In System   Outpatient Specialists: none Patient coming from:  From facility [facility]  Chief Complaint: fall  HPI: Franklin Foster is a 83 y.o. male with medical history significant of colon CA, HTN, dementia    Presented with  I have been somewhat more unstable recently patient has slurred speech but that is his baseline according to the staff at the nursing home facility  denies LOC but reports feeling wobbly. He have had one fall in the past.  Denies fever chills no chest pain no shortness of breath no visual changes no headache. Patient walks with a walker at baseline he has mild dementia.     IN ER:  Temp (24hrs), Avg:97.5 F (36.4 C), Min:97.5 F (36.4 C), Max:97.5 F (36.4 C) RR 16 HR 59 BP 123/65 N a 132 K 4.1  WBC 7.4 Hg 12.3 Trop <0.03  CT head non acute Posterior scalp soft tissue swelling without fracture. After administration of IV fluids patient continued to be ataxic     CXR non acute Right shoulder negative Left shoulder negative  Following Medications were ordered in ER: Medications  sodium chloride 0.9 % bolus 500 mL (not administered)  0.9 %  sodium chloride infusion (not administered)  sodium chloride 0.9 % bolus 500 mL (0 mLs Intravenous Stopped 11/27/16 1923)  ipratropium-albuterol (DUONEB) 0.5-2.5 (3) MG/3ML nebulizer solution 3 mL (3 mLs Nebulization Given 11/27/16 2008)     ER provider discussed case with: Neurology recommend admit to [facility] with MRI in AM  Hospitalist was called for admission for Fall and ataxia  Review of Systems:    Pertinent positives include: fatigue, shoulder pain   Constitutional:  No weight loss, night sweats, Fevers, chills, , weight loss  HEENT:  No headaches, Difficulty swallowing,Tooth/dental problems,Sore throat,  No sneezing, itching, ear ache, nasal congestion, post nasal drip,  Cardio-vascular:  No chest pain, Orthopnea, PND,  anasarca, dizziness, palpitations.no Bilateral lower extremity swelling  GI:  No heartburn, indigestion, abdominal pain, nausea, vomiting, diarrhea, change in bowel habits, loss of appetite, melena, blood in stool, hematemesis Resp:  no shortness of breath at rest. No dyspnea on exertion, No excess mucus, no productive cough, No non-productive cough, No coughing up of blood.No change in color of mucus.No wheezing. Skin:  no rash or lesions. No jaundice GU:  no dysuria, change in color of urine, no urgency or frequency. No straining to urinate.  No flank pain.  Musculoskeletal:  No joint pain or no joint swelling. No decreased range of motion. No back pain.  Psych:  No change in mood or affect. No depression or anxiety. No memory loss.  Neuro: no localizing neurological complaints, no tingling, no weakness, no double vision, no gait abnormality, no slurred speech, no confusion  As per HPI otherwise 10 point review of systems negative.   Past Medical History: Past Medical History:  Diagnosis Date  . Colon cancer ([place])   . Depressed   . GERD (gastroesophageal reflux disease)   . Hypertension   . Spinal stenosis    Past Surgical History:  Procedure Laterality Date  . CHOLECYSTECTOMY    . TONSILLECTOMY       Social History:  Ambulatory  walker      reports that he has quit smoking. He has never used smokeless tobacco. He reports that he does not drink alcohol or use drugs.  Allergies:  No Known Allergies  Family History:   Family History  Problem Relation Age of Onset  . Ovarian cancer Sister   . CAD Neg Hx     Medications: Prior to Admission medications   Medication Sig Start Date End Date Taking? Authorizing Provider  ipratropium-albuterol (DUONEB) 0.5-2.5 (3) MG/3ML SOLN Take 3 mLs by nebulization 2 (two) times daily.    Yes Historical Provider, MD  lisinopril-hydrochlorothiazide (PRINZIDE,ZESTORETIC) 10-12.5 MG tablet Take 1.5 tablets by mouth daily.   Yes  Historical Provider, MD  Multiple Vitamins-Minerals (THEREMS-M PO) Take 1 tablet by mouth daily.   Yes Historical Provider, MD  omeprazole (PRILOSEC) 20 MG capsule Take 20 mg by mouth daily.   Yes Historical Provider, MD  Tetrahydrozoline HCl (EYE DROPS OP) Apply 1 drop to eye 4 (four) times daily. "Artificial Saliva Solution"   Yes Historical Provider, MD  acetaminophen (TYLENOL) 500 MG tablet Take 1 tablet (500 mg total) by mouth every 6 (six) hours as needed for moderate pain. 03/14/16   [name], MD  albuterol (PROVENTIL HFA;VENTOLIN HFA) 108 (90 Base) MCG/ACT inhaler Inhale 1 puff into the lungs every 4 (four) hours as needed for wheezing or shortness of breath.    Historical Provider, MD    Physical Exam: Patient Vitals for the past 24 hrs:  BP Temp Temp src Pulse Resp SpO2 Height Weight  11/27/16 2042 123/65 - - (!) 59 16 100 % - -  11/27/16 2008 - - - - - 97 % - -  11/27/16 1616 (!) 116/59 - - 63 18 98 % - -  11/27/16 1421 137/73 - - (!) 57 18 100 % - -  11/27/16 1406 126/68 97.5 F (36.4 C) Oral 71 16 100 % 6' (1.829 m) 79.4 kg (175 lb)  11/27/16 1349 - - - - - 100 % - -    1. General:  in No Acute distress 2. Psychological: Alert and   Oriented 3. Head/ENT:    Dry Mucous Membranes                          Head   Traumatic back of the head, neck supple                           Poor Dentition 4. SKIN:  decreased Skin turgor,  Skin clean Dry and intact no rash 5. Heart: Regular rate and rhythm no Murmur, Rub or gallop 6. Lungs: no wheezes or crackles   7. Abdomen: Soft,  non-tender, Non distended 8. Lower extremities: no clubbing, cyanosis, or edema 9. Neurologically  strength 5 out of 5 in all 4 extremities cranial nerves II through XII intact 10. MSK: Normal range of motion   body mass index is 23.73 kg/m.  Labs on Admission:   Labs on Admission: I have personally reviewed following labs and imaging studies  CBC:  Recent Labs Lab 11/27/16 1413  WBC 7.4  HGB  12.3*  HCT 35.8*  MCV 96.2  PLT 476   Basic Metabolic Panel:  Recent Labs Lab 11/27/16 1413  NA 132*  K 4.1  CL 99*  CO2 25  GLUCOSE 102*  BUN 20  CREATININE 0.97  CALCIUM 8.8*   GFR: Estimated Creatinine Clearance: 51.1 mL/min (by C-G formula based on SCr of 0.97 mg/dL). Liver Function Tests: No results for input(s): AST, ALT, ALKPHOS, BILITOT, PROT, ALBUMIN in the last 168 hours. No results for input(s): LIPASE, AMYLASE in the  last 168 hours. No results for input(s): AMMONIA in the last 168 hours. Coagulation Profile: No results for input(s): INR, PROTIME in the last 168 hours. Cardiac Enzymes:  Recent Labs Lab 11/27/16 1413  TROPONINI <0.03   BNP (last 3 results) No results for input(s): PROBNP in the last 8760 hours. HbA1C: No results for input(s): HGBA1C in the last 72 hours. CBG:  Recent Labs Lab 11/27/16 1611  GLUCAP 120*   Lipid Profile: No results for input(s): CHOL, HDL, LDLCALC, TRIG, CHOLHDL, LDLDIRECT in the last 72 hours. Thyroid Function Tests: No results for input(s): TSH, T4TOTAL, FREET4, T3FREE, THYROIDAB in the last 72 hours. Anemia Panel: No results for input(s): VITAMINB12, FOLATE, FERRITIN, TIBC, IRON, RETICCTPCT in the last 72 hours. Urine analysis:    Component Value Date/Time   COLORURINE YELLOW 11/27/2016 [place] 11/27/2016 1413   LABSPEC 1.010 11/27/2016 1413   PHURINE 5.0 11/27/2016 1413   GLUCOSEU NEGATIVE 11/27/2016 1413   HGBUR NEGATIVE 11/27/2016 [place] 11/27/2016 1413   KETONESUR NEGATIVE 11/27/2016 1413   PROTEINUR NEGATIVE 11/27/2016 1413   NITRITE NEGATIVE 11/27/2016 1413   LEUKOCYTESUR NEGATIVE 11/27/2016 1413   Sepsis Labs: @LABRCNTIP (procalcitonin:4,lacticidven:4) )No results found for this or any previous visit (from the past 240 hour(s)).     UA   no evidence of UTI     No results found for: HGBA1C  Estimated Creatinine Clearance: 51.1 mL/min (by C-G formula based  on SCr of 0.97 mg/dL).  BNP (last 3 results) No results for input(s): PROBNP in the last 8760 hours.   ECG REPORT  Independently reviewed Rate: 62  Rhythm: sinnus ST&T Change: No acute ischemic changes   QTC 422  Filed Weights   11/27/16 1406  Weight: 79.4 kg (175 lb)     Cultures: No results found for: SDES, SPECREQUEST, CULT, REPTSTATUS   Radiological Exams on Admission: Dg Chest 2 View  Result Date: 11/27/2016 CLINICAL DATA:  Fall with back and shoulder pain. Initial encounter. EXAM: CHEST  2 VIEW COMPARISON:  None. FINDINGS: The cardiomediastinal silhouette is unremarkable. Mild interstitial prominence noted. There is no evidence of focal airspace disease, pulmonary edema, suspicious pulmonary nodule/mass, pleural effusion, or pneumothorax. No acute bony abnormalities are identified. IMPRESSION: No active cardiopulmonary disease. Electronically Signed   By: Margarette Canada M.D.   On: 11/27/2016 15:40   Dg Shoulder Right  Result Date: 11/27/2016 CLINICAL DATA:  Acute right shoulder pain following fall today. Initial encounter. EXAM: RIGHT SHOULDER - 2+ VIEW COMPARISON:  None. FINDINGS: There is no evidence of fracture or dislocation. There is no evidence of arthropathy or other focal bone abnormality. Soft tissues are unremarkable. IMPRESSION: Negative. Electronically Signed   By: Margarette Canada M.D.   On: 11/27/2016 15:41   Ct Head Wo Contrast  Result Date: 11/27/2016 CLINICAL DATA:  83 year old male with head and neck injury following fall 2 days ago. Initial encounter. EXAM: CT HEAD WITHOUT CONTRAST CT CERVICAL SPINE WITHOUT CONTRAST TECHNIQUE: Multidetector CT imaging of the head and cervical spine was performed following the standard protocol without intravenous contrast. Multiplanar CT image reconstructions of the cervical spine were also generated. COMPARISON:  03/14/2016 cervical spine radiographs FINDINGS: CT HEAD FINDINGS Brain: No evidence of acute infarction, hemorrhage,  hydrocephalus, extra-axial collection or mass lesion/mass effect. Atrophy, chronic small-vessel white matter ischemic changes and remote bilateral basal ganglia infarcts noted. Vascular: Intracranial atherosclerotic calcifications noted. Skull: No acute abnormality.  Right frontoparietal craniotomy noted. Sinuses/Orbits: Opacified right maxillary sinus noted.  No other significant abnormalities. Other: Posterior scalp soft tissue swelling identified. CT CERVICAL SPINE FINDINGS Alignment: Reversal of the normal cervical lordosis noted. Skull base and vertebrae: No acute fracture. No primary bone lesion or focal pathologic process. Soft tissues and spinal canal: No prevertebral fluid or swelling. No visible canal hematoma. Disc levels: Mild to moderate multilevel degenerative disc disease, spondylosis and facet arthropathy throughout the cervical spine noted, greatest from C3-C7. Upper chest: No acute abnormality Other: None IMPRESSION: No evidence of acute intracranial abnormality. Atrophy, chronic small-vessel white matter ischemic changes and remote basal ganglia infarcts. Posterior scalp soft tissue swelling without fracture. No static evidence of acute injury to the cervical spine. Degenerative changes as described. Electronically Signed   By: Margarette Canada M.D.   On: 11/27/2016 15:53   Ct Cervical Spine Wo Contrast  Result Date: 11/27/2016 CLINICAL DATA:  83 year old male with head and neck injury following fall 2 days ago. Initial encounter. EXAM: CT HEAD WITHOUT CONTRAST CT CERVICAL SPINE WITHOUT CONTRAST TECHNIQUE: Multidetector CT imaging of the head and cervical spine was performed following the standard protocol without intravenous contrast. Multiplanar CT image reconstructions of the cervical spine were also generated. COMPARISON:  03/14/2016 cervical spine radiographs FINDINGS: CT HEAD FINDINGS Brain: No evidence of acute infarction, hemorrhage, hydrocephalus, extra-axial collection or mass lesion/mass  effect. Atrophy, chronic small-vessel white matter ischemic changes and remote bilateral basal ganglia infarcts noted. Vascular: Intracranial atherosclerotic calcifications noted. Skull: No acute abnormality.  Right frontoparietal craniotomy noted. Sinuses/Orbits: Opacified right maxillary sinus noted. No other significant abnormalities. Other: Posterior scalp soft tissue swelling identified. CT CERVICAL SPINE FINDINGS Alignment: Reversal of the normal cervical lordosis noted. Skull base and vertebrae: No acute fracture. No primary bone lesion or focal pathologic process. Soft tissues and spinal canal: No prevertebral fluid or swelling. No visible canal hematoma. Disc levels: Mild to moderate multilevel degenerative disc disease, spondylosis and facet arthropathy throughout the cervical spine noted, greatest from C3-C7. Upper chest: No acute abnormality Other: None IMPRESSION: No evidence of acute intracranial abnormality. Atrophy, chronic small-vessel white matter ischemic changes and remote basal ganglia infarcts. Posterior scalp soft tissue swelling without fracture. No static evidence of acute injury to the cervical spine. Degenerative changes as described. Electronically Signed   By: Margarette Canada M.D.   On: 11/27/2016 15:53   Dg Shoulder Left  Result Date: 11/27/2016 CLINICAL DATA:  Acute left shoulder pain following fall today. Initial encounter. EXAM: LEFT SHOULDER - 2+ VIEW COMPARISON:  None. FINDINGS: There is no evidence of fracture or dislocation. There is no evidence of arthropathy or other focal bone abnormality. Soft tissues are unremarkable. IMPRESSION: Negative. Electronically Signed   By: Margarette Canada M.D.   On: 11/27/2016 15:42    Chart has been reviewed    Assessment/Plan  83 y.o. male with medical history significant of colon CA, HTN, dementia admitted for fall and ataxia  Present on Admission: . Syncope vs fall: Patient states he just fell unsure how. We'll monitor on telemetry  given ataxia obtain MRI if positive will need neurology consult, cycle cardiac enzymes monitor on telemetry Pt /OT evaluation and discharge to Nursing facility . Dehydration - rehydrate . Essential hypertension - allow some permissive hypertension for tonight   Other plan as per orders.  DVT prophylaxis:  SCD      Code Status:  FULL CODE  as per reccords  Family Communication:   Family not  at  Bedside    Disposition Plan:  Back to current facility when stable                              Would benefit from PT/OT eval prior to DC   ordered                      Social Work   consulted                          Consults called: none    Admission status    obs   Level of care    tele             I have spent a total of 56 min on this admission   Rishik Tubby 11/27/2016, 10:42 PM    [facility]  Pager [phone]   after 2 AM please page floor coverage PA If 7AM-7PM, please contact the day team taking care of the patient  [email or web address]  Password [email or web address]

## 2016-11-28 ENCOUNTER — Observation Stay (HOSPITAL_COMMUNITY): Payer: Medicare Other

## 2016-11-28 DIAGNOSIS — E860 Dehydration: Secondary | ICD-10-CM | POA: Diagnosis not present

## 2016-11-28 DIAGNOSIS — R55 Syncope and collapse: Secondary | ICD-10-CM | POA: Diagnosis not present

## 2016-11-28 DIAGNOSIS — S0003XA Contusion of scalp, initial encounter: Secondary | ICD-10-CM | POA: Diagnosis not present

## 2016-11-28 DIAGNOSIS — I10 Essential (primary) hypertension: Secondary | ICD-10-CM

## 2016-11-28 DIAGNOSIS — F0390 Unspecified dementia without behavioral disturbance: Secondary | ICD-10-CM | POA: Diagnosis not present

## 2016-11-28 LAB — LIPID PANEL
Cholesterol: 109 mg/dL (ref 0–200)
HDL: 39 mg/dL — ABNORMAL LOW (ref >40)
LDL CALC: 59 mg/dL (ref 0–99)
Total CHOL/HDL Ratio: 2.8 RATIO
Triglycerides: 56 mg/dL (ref <150)
VLDL: 11 mg/dL (ref 0–40)

## 2016-11-28 LAB — MRSA PCR SCREENING: MRSA BY PCR: POSITIVE — AB

## 2016-11-28 LAB — TROPONIN I: Troponin I: <0.03 ng/mL (ref <0.03)

## 2016-11-28 MED ORDER — SENNOSIDES-DOCUSATE SODIUM 8.6-50 MG PO TABS: 1.0000 | ORAL_TABLET | Freq: Every evening | ORAL | Status: DC | PRN

## 2016-11-28 MED ORDER — CHLORHEXIDINE GLUCONATE 0.12 % MT SOLN
15.0000 mL | Freq: Two times a day (BID) | OROMUCOSAL | Status: DC
  Administered 2016-11-29: 15 mL via OROMUCOSAL
  Filled 2016-11-28: qty 15

## 2016-11-28 MED ORDER — SODIUM CHLORIDE 0.9 % IV SOLN
INTRAVENOUS | Status: DC
  Administered 2016-11-28: via INTRAVENOUS

## 2016-11-28 MED ORDER — MUPIROCIN 2 % EX OINT
1.0000 "application " | TOPICAL_OINTMENT | Freq: Two times a day (BID) | CUTANEOUS | Status: DC
  Administered 2016-11-28 – 2016-11-29 (×4): 1 via NASAL
  Filled 2016-11-28: qty 22

## 2016-11-28 MED ORDER — PANTOPRAZOLE SODIUM 40 MG PO TBEC
40.0000 mg | DELAYED_RELEASE_TABLET | Freq: Every day | ORAL | Status: DC
  Administered 2016-11-28 – 2016-11-29 (×2): 40 mg via ORAL
  Filled 2016-11-28 (×2): qty 1

## 2016-11-28 MED ORDER — RESOURCE THICKENUP CLEAR PO POWD
ORAL | Status: DC | PRN
  Filled 2016-11-28: qty 125

## 2016-11-28 MED ORDER — ORAL CARE MOUTH RINSE
15.0000 mL | Freq: Two times a day (BID) | OROMUCOSAL | Status: DC
  Administered 2016-11-29: 15 mL via OROMUCOSAL

## 2016-11-28 MED ORDER — CHLORHEXIDINE GLUCONATE CLOTH 2 % EX PADS
6.0000 | MEDICATED_PAD | Freq: Every day | CUTANEOUS | Status: DC
  Administered 2016-11-28: 6 via TOPICAL

## 2016-11-28 MED ORDER — TETRAHYDROZOLINE HCL 0.05 % OP SOLN
1.0000 [drp] | Freq: Four times a day (QID) | OPHTHALMIC | Status: DC
Start: 2016-11-28 — End: 2016-11-29
  Administered 2016-11-28 – 2016-11-29 (×6): 1 [drp] via OPHTHALMIC
  Filled 2016-11-28: qty 15

## 2016-11-28 MED ORDER — ACETAMINOPHEN 650 MG RE SUPP: 650.0000 mg | RECTAL | Status: DC | PRN

## 2016-11-28 MED ORDER — ACETAMINOPHEN 325 MG PO TABS
650.0000 mg | ORAL_TABLET | ORAL | Status: DC | PRN
  Administered 2016-11-28 – 2016-11-29 (×2): 650 mg via ORAL
  Filled 2016-11-28 (×2): qty 2

## 2016-11-28 MED ORDER — ASPIRIN 325 MG PO TABS
325.0000 mg | ORAL_TABLET | Freq: Every day | ORAL | Status: DC
  Administered 2016-11-29: 325 mg via ORAL
  Filled 2016-11-28: qty 1

## 2016-11-28 MED ORDER — ASPIRIN 300 MG RE SUPP
300.0000 mg | Freq: Every day | RECTAL | Status: DC
  Administered 2016-11-28: 300 mg via RECTAL
  Filled 2016-11-28 (×2): qty 1

## 2016-11-28 MED ORDER — STROKE: EARLY STAGES OF RECOVERY BOOK: Freq: Once | Status: DC

## 2016-11-28 MED ORDER — ACETAMINOPHEN 160 MG/5ML PO SOLN: 650.0000 mg | ORAL | Status: DC | PRN

## 2016-11-28 MED ORDER — IPRATROPIUM-ALBUTEROL 0.5-2.5 (3) MG/3ML IN SOLN
3.0000 mL | Freq: Two times a day (BID) | RESPIRATORY_TRACT | Status: DC
  Administered 2016-11-28 – 2016-11-29 (×3): 3 mL via RESPIRATORY_TRACT
  Filled 2016-11-28 (×3): qty 3

## 2016-11-28 NOTE — Progress Notes (Signed)
Modified Barium Swallow Progress Note  Patient Details  Name: Franklin Foster MRN: [id number] Date of Birth: 05-Dec-1919  Today's Date: 11/28/2016  Modified Barium Swallow completed.  Full report located under Chart Review in the Imaging Section.  Brief recommendations include the following:  Clinical Impression  Pt presents with a mild oral dysphagia with piecemeal transit and lingual residue that pt senses and clears with second swallow. Mild pharyngeal dysphagia with flash penetration as well as one aspiration episode of thin before (delayed weak ineffective cough) the swallow due to decreased oral cohesion and control and premature spill with thin. No airway compromise with one thin trial performing chin tuck although he cannot perform independently due to dementia/obvious memory deficits. Pt has more patent hypopharyngeal space due to cervical kyphosis which may contribute to decreased airway closure. Mild vallecular and pyriform sinsus residue cleared with reflexive swallows. Important to note that pt produced reflexive cough multiple times throughout MBS without penetration or aspiration observed. MBS does not diagnose below level of the UES however esophageal scan performed without significant observations. UES appeared intermittently tight. Dr. Bonner Puna present on floor and discussed options of continuing thin liquid given age, one instance of aspiration, ambulatory, no recurrent pna versus short trial of thickener while he is medically compromised with return to thin in near future. Agreed upon for trial of thickener and MD will speak with family re: wishes for thin liquid. Prognosis is good for returning to thin. Recomemnd Speech therapy at ALF, Dys 3 texture, nectar thick liquids, no straws, crush meds.         Swallow Evaluation Recommendations       SLP Diet Recommendations: Dysphagia 3 (Mech soft) solids;Nectar thick liquid   Liquid Administration via: Cup;No straw   Medication  Administration: Crushed with puree   Supervision: Patient able to self feed;Full supervision/cueing for compensatory strategies   Compensations: Slow rate;Small sips/bites       Oral Care Recommendations: Oral care BID        Franklin Foster 11/28/2016,12:03 PM  Franklin Foster.[employer] [phone]

## 2016-11-28 NOTE — Progress Notes (Signed)
PROGRESS NOTE  Franklin Foster  GGY:[id number] DOB: Apr 24, 1920 DOA: 11/27/2016 PCP: Pcp Not In System   Brief Narrative: Franklin Foster is a 83 y.o. male with a history of colon CA, dementia and HTN who presented from [facility] assisted living after falling backward while pushing his wife in a wheelchair. He is unable to provide history due to dementia and his only complaint was pain in shoulders and the back of the head. He denied LOC. He has had a few falls in the past. On presentation he was in no distress, vitals within normal limits, oriented only to person, with bruising to the occipital scalp without laceration. CT head showed no bleeding or acute stroke and XR of bilateral shoulders were negative. Neurology was consulted and recommended MRI and admission. Subsequent MRI showed no acute bleeding, mass, or infarct. Physical therapy has recommended 24-hour supervision, either at ALF with home health or at SNF. CSW has been consulted to assist with placement.   Assessment & Plan: Active Problems:   Syncope   Dehydration   Essential hypertension   Ataxia  Fall: No LOC, but otherwise unknown mechanism. CT head negative. MRI performed this evening is found to be negative. - No telemetry events to date to support Dx cardiogenic syncope. Irritating pt, so will DC telemetry.  - PT recommends 24-hr supervision and pt seems impulsive and unsteady, very high risk for falling. I've discussed this with the patient's daughter who states he is a veteran with ?VA benefits. I did inform her of the patient's observation status and its implications. It is unclear what level of care/supervision can be provided at [facility], so I have discussed this with Social Work who is assisting with safe discharge to either ALF w/sitter provided by family or SNF.   Essential HTN: Normotensive holding BP medications.  - Monitor per unit protocol  DVT prophylaxis: SCDs Code Status: Full Family Communication:  Daughter by phone Disposition Plan: Unclear, ALF w/24-hr supervision vs. SNF.  Consultants:   [facility] by phone only  Procedures:   None  Antimicrobials:  None   Subjective: Pt with posterior scalp pain that is mild, constant, improved with po medication. No other complaints. Not oriented.  Objective: Vitals:   11/28/16 0000 11/28/16 0006 11/28/16 0500 11/28/16 1438  BP:  131/70 130/65 121/71  Pulse:  63 67 75  Resp:  18 16 18   Temp:  98 F (36.7 C) 98 F (36.7 C) 97.8 F (36.6 C)  TempSrc:  Oral Oral Oral  SpO2:  100% 100% 99%  Weight: 71.6 kg (157 lb 12.8 oz)     Height: 6' (1.829 m)       Intake/Output Summary (Last 24 hours) at 11/28/16 1723 Last data filed at 11/28/16 1500  Gross per 24 hour  Intake          1821.25 ml  Output             1250 ml  Net           571.25 ml   Filed Weights   11/27/16 1406 11/28/16 0000  Weight: 79.4 kg (175 lb) 71.6 kg (157 lb 12.8 oz)    Examination: General exam: 83 y.o. male in no distress Respiratory system: Non-labored breathing room air. Clear to auscultation bilaterally.  Cardiovascular system: Regular rate and rhythm. No murmur, rub, or gallop. No JVD, and no pedal edema. Gastrointestinal system: Abdomen soft, non-tender, non-distended, with normoactive bowel sounds. No organomegaly or masses felt. Central nervous  system: Alert, oriented to person only. Dysarthria noted. CN's intact, no focal weakness x4 extremities. Diffusely weak. Extremities: Warm, no deformities Skin: ~4cm diameter nonraised contusion to the posterior scalp/inion. Psychiatry: Mood appears normal. Unable to reliably determine due to confusion.  Data Reviewed: I have personally reviewed following labs and imaging studies  CBC:  Recent Labs Lab 11/27/16 1413  WBC 7.4  HGB 12.3*  HCT 35.8*  MCV 96.2  PLT 998   Basic Metabolic Panel:  Recent Labs Lab 11/27/16 1413  NA 132*  K 4.1  CL 99*  CO2 25  GLUCOSE 102*  BUN 20    CREATININE 0.97  CALCIUM 8.8*   GFR: Estimated Creatinine Clearance: 47.2 mL/min (by C-G formula based on SCr of 0.97 mg/dL). Liver Function Tests: No results for input(s): AST, ALT, ALKPHOS, BILITOT, PROT, ALBUMIN in the last 168 hours. No results for input(s): LIPASE, AMYLASE in the last 168 hours. No results for input(s): AMMONIA in the last 168 hours. Coagulation Profile: No results for input(s): INR, PROTIME in the last 168 hours. Cardiac Enzymes:  Recent Labs Lab 11/27/16 1413 11/28/16 0111  TROPONINI <0.03 <0.03   BNP (last 3 results) No results for input(s): PROBNP in the last 8760 hours. HbA1C: No results for input(s): HGBA1C in the last 72 hours. CBG:  Recent Labs Lab 11/27/16 1611  GLUCAP 120*   Lipid Profile:  Recent Labs  11/28/16 0111  CHOL 109  HDL 39*  LDLCALC 59  TRIG 56  CHOLHDL 2.8   Thyroid Function Tests: No results for input(s): TSH, T4TOTAL, FREET4, T3FREE, THYROIDAB in the last 72 hours. Anemia Panel: No results for input(s): VITAMINB12, FOLATE, FERRITIN, TIBC, IRON, RETICCTPCT in the last 72 hours. Urine analysis:    Component Value Date/Time   COLORURINE YELLOW 11/27/2016 [place] 11/27/2016 1413   LABSPEC 1.010 11/27/2016 1413   PHURINE 5.0 11/27/2016 1413   GLUCOSEU NEGATIVE 11/27/2016 1413   HGBUR NEGATIVE 11/27/2016 1413   BILIRUBINUR NEGATIVE 11/27/2016 1413   KETONESUR NEGATIVE 11/27/2016 1413   PROTEINUR NEGATIVE 11/27/2016 1413   NITRITE NEGATIVE 11/27/2016 [place] 11/27/2016 1413   Recent Results (from the past 240 hour(s))  MRSA PCR Screening     Status: Abnormal   Collection Time: 11/28/16 12:10 AM  Result Value Ref Range Status   MRSA by PCR POSITIVE (A) NEGATIVE Final    Comment:        The GeneXpert MRSA Assay (FDA approved for NASAL specimens only), is one component of a comprehensive MRSA colonization surveillance program. It is not intended to diagnose  MRSA infection nor to guide or monitor treatment for MRSA infections. CRITICAL RESULT CALLED TO, READ BACK BY AND VERIFIED WITH: BLAINE,C [phone] [facility]       Radiology Studies: Dg Chest 2 View  Result Date: 11/27/2016 CLINICAL DATA:  Fall with back and shoulder pain. Initial encounter. EXAM: CHEST  2 VIEW COMPARISON:  None. FINDINGS: The cardiomediastinal silhouette is unremarkable. Mild interstitial prominence noted. There is no evidence of focal airspace disease, pulmonary edema, suspicious pulmonary nodule/mass, pleural effusion, or pneumothorax. No acute bony abnormalities are identified. IMPRESSION: No active cardiopulmonary disease. Electronically Signed   By: Margarette Canada M.D.   On: 11/27/2016 15:40   Dg Shoulder Right  Result Date: 11/27/2016 CLINICAL DATA:  Acute right shoulder pain following fall today. Initial encounter. EXAM: RIGHT SHOULDER - 2+ VIEW COMPARISON:  None. FINDINGS: There is no evidence of fracture or dislocation. There is  no evidence of arthropathy or other focal bone abnormality. Soft tissues are unremarkable. IMPRESSION: Negative. Electronically Signed   By: Margarette Canada M.D.   On: 11/27/2016 15:41   Ct Head Wo Contrast  Result Date: 11/27/2016 CLINICAL DATA:  83 year old male with head and neck injury following fall 2 days ago. Initial encounter. EXAM: CT HEAD WITHOUT CONTRAST CT CERVICAL SPINE WITHOUT CONTRAST TECHNIQUE: Multidetector CT imaging of the head and cervical spine was performed following the standard protocol without intravenous contrast. Multiplanar CT image reconstructions of the cervical spine were also generated. COMPARISON:  03/14/2016 cervical spine radiographs FINDINGS: CT HEAD FINDINGS Brain: No evidence of acute infarction, hemorrhage, hydrocephalus, extra-axial collection or mass lesion/mass effect. Atrophy, chronic small-vessel white matter ischemic changes and remote bilateral basal ganglia infarcts noted. Vascular: Intracranial  atherosclerotic calcifications noted. Skull: No acute abnormality.  Right frontoparietal craniotomy noted. Sinuses/Orbits: Opacified right maxillary sinus noted. No other significant abnormalities. Other: Posterior scalp soft tissue swelling identified. CT CERVICAL SPINE FINDINGS Alignment: Reversal of the normal cervical lordosis noted. Skull base and vertebrae: No acute fracture. No primary bone lesion or focal pathologic process. Soft tissues and spinal canal: No prevertebral fluid or swelling. No visible canal hematoma. Disc levels: Mild to moderate multilevel degenerative disc disease, spondylosis and facet arthropathy throughout the cervical spine noted, greatest from C3-C7. Upper chest: No acute abnormality Other: None IMPRESSION: No evidence of acute intracranial abnormality. Atrophy, chronic small-vessel white matter ischemic changes and remote basal ganglia infarcts. Posterior scalp soft tissue swelling without fracture. No static evidence of acute injury to the cervical spine. Degenerative changes as described. Electronically Signed   By: Margarette Canada M.D.   On: 11/27/2016 15:53   Ct Cervical Spine Wo Contrast  Result Date: 11/27/2016 CLINICAL DATA:  83 year old male with head and neck injury following fall 2 days ago. Initial encounter. EXAM: CT HEAD WITHOUT CONTRAST CT CERVICAL SPINE WITHOUT CONTRAST TECHNIQUE: Multidetector CT imaging of the head and cervical spine was performed following the standard protocol without intravenous contrast. Multiplanar CT image reconstructions of the cervical spine were also generated. COMPARISON:  03/14/2016 cervical spine radiographs FINDINGS: CT HEAD FINDINGS Brain: No evidence of acute infarction, hemorrhage, hydrocephalus, extra-axial collection or mass lesion/mass effect. Atrophy, chronic small-vessel white matter ischemic changes and remote bilateral basal ganglia infarcts noted. Vascular: Intracranial atherosclerotic calcifications noted. Skull: No acute  abnormality.  Right frontoparietal craniotomy noted. Sinuses/Orbits: Opacified right maxillary sinus noted. No other significant abnormalities. Other: Posterior scalp soft tissue swelling identified. CT CERVICAL SPINE FINDINGS Alignment: Reversal of the normal cervical lordosis noted. Skull base and vertebrae: No acute fracture. No primary bone lesion or focal pathologic process. Soft tissues and spinal canal: No prevertebral fluid or swelling. No visible canal hematoma. Disc levels: Mild to moderate multilevel degenerative disc disease, spondylosis and facet arthropathy throughout the cervical spine noted, greatest from C3-C7. Upper chest: No acute abnormality Other: None IMPRESSION: No evidence of acute intracranial abnormality. Atrophy, chronic small-vessel white matter ischemic changes and remote basal ganglia infarcts. Posterior scalp soft tissue swelling without fracture. No static evidence of acute injury to the cervical spine. Degenerative changes as described. Electronically Signed   By: Margarette Canada M.D.   On: 11/27/2016 15:53   Mr Brain Wo Contrast  Result Date: 11/28/2016 CLINICAL DATA:  Slurred speech.  History of colon cancer. EXAM: MRI HEAD WITHOUT CONTRAST MRA HEAD WITHOUT CONTRAST TECHNIQUE: Multiplanar, multiecho pulse sequences of the brain and surrounding structures were obtained without intravenous contrast. Angiographic images of the head  were obtained using MRA technique without contrast. COMPARISON:  Head CT 11/27/2016 FINDINGS: MRI HEAD FINDINGS Brain: No focal diffusion restriction to indicate acute infarct. No intraparenchymal hemorrhage. There is diffuse cerebral atrophy. There is beginning confluent hyperintense T2-weighted signal within the periventricular white matter, most often seen in the setting of chronic microvascular ischemia. No mass lesion or midline shift. No hydrocephalus or extra-axial fluid collection. The midline structures are normal. Skull and upper cervical spine:  The visualized skull base, calvarium, upper cervical spine and extracranial soft tissues are normal. Sinuses/Orbits: Complete opacification of the right maxillary sinus. No mastoid effusion. Normal orbits. MRA HEAD FINDINGS Intracranial internal carotid arteries: Normal. Anterior cerebral arteries: Normal. Middle cerebral arteries: Normal. Posterior communicating arteries: Absent bilaterally Posterior cerebral arteries: Normal. Basilar artery: Normal. Vertebral arteries: Codominant. Normal. Superior cerebellar arteries: Normal. Anterior inferior cerebellar arteries: Normal on the right. Not clearly visualized on the left. Posterior inferior cerebellar arteries: Normal on the right. Not clearly visualized on the left. IMPRESSION: 1. No acute intracranial abnormality. 2. Diffuse cerebral atrophy and chronic microvascular ischemia. 3. No occlusion or high-grade stenosis of the arteries of the circle of Willis. Electronically Signed   By: Ulyses Jarred M.D.   On: 11/28/2016 17:07   Dg Shoulder Left  Result Date: 11/27/2016 CLINICAL DATA:  Acute left shoulder pain following fall today. Initial encounter. EXAM: LEFT SHOULDER - 2+ VIEW COMPARISON:  None. FINDINGS: There is no evidence of fracture or dislocation. There is no evidence of arthropathy or other focal bone abnormality. Soft tissues are unremarkable. IMPRESSION: Negative. Electronically Signed   By: Margarette Canada M.D.   On: 11/27/2016 15:42   Dg Swallowing Func-speech Pathology  Result Date: 11/28/2016 Objective Swallowing Evaluation: Type of Study: MBS-Modified Barium Swallow Study Patient Details Name: Samson Ralph MRN: [id number] Date of Birth: 07-01-1920 Today's Date: 11/28/2016 Time: SLP Start Time (ACUTE ONLY): 1058-SLP Stop Time (ACUTE ONLY): 1115 SLP Time Calculation (min) (ACUTE ONLY): 17 min Past Medical History: Past Medical History: Diagnosis Date . Colon cancer ([place])  . Depressed  . GERD (gastroesophageal reflux disease)  . Hypertension  . Spinal  stenosis  Past Surgical History: Past Surgical History: Procedure Laterality Date . CHOLECYSTECTOMY   . TONSILLECTOMY   HPI: Zayden Maffei a 83 y.o.malewith medical history significant of GERD, colon CA, HTN, dementia. CT no evidence of acute intracranial abnormality. Atrophy, chronic small-vessel white matter ischemic changes and remote basal ganglia No Data Recorded Assessment / Plan / Recommendation CHL IP CLINICAL IMPRESSIONS 11/28/2016 Clinical Impression Pt presents with a mild oral dysphagia with piecemeal transit and lingual residue that pt senses and clears with second swallow. Mild pharyngeal dysphagia with flash penetration as well as one aspiration episode of thin before (delayed weak ineffective cough) the swallow due to decreased oral cohesion and control and premature spill with thin. No airway compromise with one thin trial performing chin tuck although he cannot perform independently due to dementia/obvious memory deficits. Pt has more patent hypopharyngeal space due to cervical kyphosis which may contribute to decreased airway closure. Mild vallecular and pyriform sinsus residue cleared with reflexive swallows. Important to note that pt produced reflexive cough multiple times throughout MBS without penetration or aspiration observed. MBS does not diagnose below level of the UES however esophageal scan performed without significant observations. UES appeared intermittently tight. Dr. Bonner Puna present on floor and discussed options of continuing thin liquid given age, one instance of aspiration, ambulatory, no recurrent pna versus short trial of thickener while he is medically compromised  with return to thin in near future. Agreed upon for trial of thickener and MD will speak with family re: wishes for thin liquid. Prognosis is good for returning to thin. Recomemnd Speech therapy at ALF, Dys 3 texture, nectar thick liquids, no straws, crush meds.       SLP Visit Diagnosis Dysphagia, oropharyngeal phase  (R13.12) Attention and concentration deficit following -- Frontal lobe and executive function deficit following -- Impact on safety and function Moderate aspiration risk   CHL IP TREATMENT RECOMMENDATION 11/28/2016 Treatment Recommendations Therapy as outlined in treatment plan below   Prognosis 11/28/2016 Prognosis for Safe Diet Advancement Good Barriers to Reach Goals -- Barriers/Prognosis Comment -- CHL IP DIET RECOMMENDATION 11/28/2016 SLP Diet Recommendations Dysphagia 3 (Mech soft) solids;Nectar thick liquid Liquid Administration via Cup;No straw Medication Administration Crushed with puree Compensations Slow rate;Small sips/bites Postural Changes --   CHL IP OTHER RECOMMENDATIONS 11/28/2016 Recommended Consults -- Oral Care Recommendations Oral care BID Other Recommendations --   CHL IP FOLLOW UP RECOMMENDATIONS 11/28/2016 Follow up Recommendations 24 hour supervision/assistance   CHL IP FREQUENCY AND DURATION 11/28/2016 Speech Therapy Frequency (ACUTE ONLY) min 1 x/week Treatment Duration 1 week      CHL IP ORAL PHASE 11/28/2016 Oral Phase Impaired Oral - Pudding Teaspoon -- Oral - Pudding Cup -- Oral - Honey Teaspoon -- Oral - Honey Cup -- Oral - Nectar Teaspoon -- Oral - Nectar Cup Piecemeal swallowing;Lingual/palatal residue Oral - Nectar Straw -- Oral - Thin Teaspoon NT Oral - Thin Cup Piecemeal swallowing;Lingual/palatal residue;Premature spillage Oral - Thin Straw NT Oral - Puree -- Oral - Mech Soft -- Oral - Regular -- Oral - Multi-Consistency -- Oral - Pill -- Oral Phase - Comment --  CHL IP PHARYNGEAL PHASE 11/28/2016 Pharyngeal Phase Impaired Pharyngeal- Pudding Teaspoon -- Pharyngeal -- Pharyngeal- Pudding Cup -- Pharyngeal -- Pharyngeal- Honey Teaspoon -- Pharyngeal -- Pharyngeal- Honey Cup -- Pharyngeal -- Pharyngeal- Nectar Teaspoon -- Pharyngeal -- Pharyngeal- Nectar Cup Pharyngeal residue - valleculae;Pharyngeal residue - pyriform;Reduced laryngeal elevation;Reduced epiglottic inversion Pharyngeal --  Pharyngeal- Nectar Straw -- Pharyngeal -- Pharyngeal- Thin Teaspoon -- Pharyngeal -- Pharyngeal- Thin Cup Penetration/Aspiration before swallow;Penetration/Aspiration during swallow;Reduced airway/laryngeal closure;Pharyngeal residue - valleculae;Pharyngeal residue - pyriform;Reduced epiglottic inversion Pharyngeal Material enters airway, passes BELOW cords and not ejected out despite cough attempt by patient;Material enters airway, remains ABOVE vocal cords then ejected out Pharyngeal- Thin Straw -- Pharyngeal -- Pharyngeal- Puree WFL Pharyngeal -- Pharyngeal- Mechanical Soft -- Pharyngeal -- Pharyngeal- Regular Delayed swallow initiation-vallecula Pharyngeal -- Pharyngeal- Multi-consistency -- Pharyngeal -- Pharyngeal- Pill -- Pharyngeal -- Pharyngeal Comment --  CHL IP CERVICAL ESOPHAGEAL PHASE 11/28/2016 Cervical Esophageal Phase Impaired Pudding Teaspoon -- Pudding Cup -- Honey Teaspoon -- Honey Cup -- Nectar Teaspoon -- Nectar Cup -- Nectar Straw -- Thin Teaspoon -- Thin Cup -- Thin Straw -- Puree -- Mechanical Soft -- Regular -- Multi-consistency -- Pill -- Cervical Esophageal Comment -- CHL IP GO 11/28/2016 Functional Assessment Tool Used skilled clinical judgement Functional Limitations Swallowing Swallow Current Status (T2458) CK Swallow Goal Status (K9983) CK Swallow Discharge Status (J8250) CK Motor Speech Current Status (N3976) (None) Motor Speech Goal Status (B3419) (None) Motor Speech Goal Status (F7902) (None) Spoken Language Comprehension Current Status (I0973) (None) Spoken Language Comprehension Goal Status (Z3299) (None) Spoken Language Comprehension Discharge Status (M4268) (None) Spoken Language Expression Current Status (T4196) (None) Spoken Language Expression Goal Status (Q2297) (None) Spoken Language Expression Discharge Status (L8921) (None) Attention Current Status (J9417) (None) Attention Goal Status (E0814) (None) Attention Discharge  Status [phone]) (None) Memory Current Status [phone])  (None) Memory Goal Status (G2542) (None) Memory Discharge Status (H0623) (None) Voice Current Status (J6283) (None) Voice Goal Status (T5176) (None) Voice Discharge Status (H6073) (None) Other Speech-Language Pathology Functional Limitation Current Status (X1062) (None) Other Speech-Language Pathology Functional Limitation Goal Status (I9485) (None) Other Speech-Language Pathology Functional Limitation Discharge Status [phone]) (None) [place] Siren 11/28/2016, 12:02 PM Orbie Pyo Colvin Caroli.Ed CCC-SLP Pager [phone]              Mr Jodene Nam Head/brain Wo Cm  Result Date: 11/28/2016 CLINICAL DATA:  Slurred speech.  History of colon cancer. EXAM: MRI HEAD WITHOUT CONTRAST MRA HEAD WITHOUT CONTRAST TECHNIQUE: Multiplanar, multiecho pulse sequences of the brain and surrounding structures were obtained without intravenous contrast. Angiographic images of the head were obtained using MRA technique without contrast. COMPARISON:  Head CT 11/27/2016 FINDINGS: MRI HEAD FINDINGS Brain: No focal diffusion restriction to indicate acute infarct. No intraparenchymal hemorrhage. There is diffuse cerebral atrophy. There is beginning confluent hyperintense T2-weighted signal within the periventricular white matter, most often seen in the setting of chronic microvascular ischemia. No mass lesion or midline shift. No hydrocephalus or extra-axial fluid collection. The midline structures are normal. Skull and upper cervical spine: The visualized skull base, calvarium, upper cervical spine and extracranial soft tissues are normal. Sinuses/Orbits: Complete opacification of the right maxillary sinus. No mastoid effusion. Normal orbits. MRA HEAD FINDINGS Intracranial internal carotid arteries: Normal. Anterior cerebral arteries: Normal. Middle cerebral arteries: Normal. Posterior communicating arteries: Absent bilaterally Posterior cerebral arteries: Normal. Basilar artery: Normal. Vertebral arteries: Codominant. Normal. Superior  cerebellar arteries: Normal. Anterior inferior cerebellar arteries: Normal on the right. Not clearly visualized on the left. Posterior inferior cerebellar arteries: Normal on the right. Not clearly visualized on the left. IMPRESSION: 1. No acute intracranial abnormality. 2. Diffuse cerebral atrophy and chronic microvascular ischemia. 3. No occlusion or high-grade stenosis of the arteries of the circle of Willis. Electronically Signed   By: Ulyses Jarred M.D.   On: 11/28/2016 17:07    Scheduled Meds: .  stroke: mapping our early stages of recovery book   Does not apply Once  . aspirin  300 mg Rectal Daily   Or  . aspirin  325 mg Oral Daily  . Chlorhexidine Gluconate Cloth  6 each Topical Q0600  . ipratropium-albuterol  3 mL Nebulization BID  . mupirocin ointment  1 application Nasal BID  . pantoprazole  40 mg Oral Daily  . tetrahydrozoline  1 drop Both Eyes QID   Continuous Infusions: . sodium chloride Stopped (11/28/16 0947)  . sodium chloride       LOS: 0 days   Time spent: 25 minutes.  Vance Gather, MD [facility] Pager [phone]  If 7PM-7AM, please contact night-coverage [email or web address] Password [facility] [place] 11/28/2016, 5:23 PM

## 2016-11-28 NOTE — Evaluation (Signed)
Clinical/Bedside Swallow Evaluation Patient Details  Name: Franklin Foster MRN: [id number] Date of Birth: 1919-12-26  Today's Date: 11/28/2016 Time: SLP Start Time (ACUTE ONLY): 11 SLP Stop Time (ACUTE ONLY): 1019 SLP Time Calculation (min) (ACUTE ONLY): 12 min  Past Medical History:  Past Medical History:  Diagnosis Date  . Colon cancer ([place])   . Depressed   . GERD (gastroesophageal reflux disease)   . Hypertension   . Spinal stenosis    Past Surgical History:  Past Surgical History:  Procedure Laterality Date  . CHOLECYSTECTOMY    . TONSILLECTOMY     HPI:  Franklin Foster a 83 y.o.malewith medical history significant of GERD, colon CA, HTN, dementia. CT no evidence of acute intracranial abnormality. Atrophy, chronic small-vessel white matter ischemic changes and remote basal ganglia   Assessment / Plan / Recommendation Clinical Impression  Suspect decreased airway protection, pharyngeal residue and globus sensation due to immediate consistent cough following thin and grimace after puree. MRI pending. Speech dysarthric with questionable hyponasality. Recommend MBS to assess swallow function make safest recommendations re: solids and liquids. MBS scheduled for approximately 11.  SLP Visit Diagnosis: Dysphagia, unspecified (R13.10)    Aspiration Risk  Moderate aspiration risk    Diet Recommendation NPO   Postural Changes: Seated upright at 90 degrees    Other  Recommendations Oral Care Recommendations: Oral care BID   Follow up Recommendations  (TBD)      Frequency and Duration            Prognosis        Swallow Study   General HPI: Franklin Foster a 83 y.o.malewith medical history significant of GERD, colon CA, HTN, dementia. CT no evidence of acute intracranial abnormality. Atrophy, chronic small-vessel white matter ischemic changes and remote basal ganglia Type of Study: Bedside Swallow Evaluation Previous Swallow Assessment:  (none found) Diet Prior to this  Study: NPO Temperature Spikes Noted: No Respiratory Status: Room air History of Recent Intubation: No Behavior/Cognition: Alert;Cooperative;Pleasant mood;Requires cueing Oral Cavity Assessment: Within Functional Limits Oral Care Completed by SLP: No Oral Cavity - Dentition: Adequate natural dentition Vision: Functional for self-feeding Self-Feeding Abilities: Able to feed self Patient Positioning: Upright in bed Baseline Vocal Quality: Normal (? hyponasal ?) Volitional Cough: Strong Volitional Swallow: Able to elicit    Oral/Motor/Sensory Function Overall Oral Motor/Sensory Function: Within functional limits   Ice Chips Ice chips: Not tested   Thin Liquid Thin Liquid: Impaired Presentation: Cup Oral Phase Functional Implications:  (none) Pharyngeal  Phase Impairments: Cough - Immediate    Nectar Thick Nectar Thick Liquid: Not tested   Honey Thick Honey Thick Liquid: Not tested   Puree Puree: Within functional limits   Solid   GO   Solid: Impaired Oral Phase Functional Implications: Prolonged oral transit    Functional Assessment Tool Used: skilled clinical judgement Functional Limitations: Swallowing Swallow Current Status (X3244): At least 60 percent but less than 80 percent impaired, limited or restricted Swallow Goal Status [phone]): At least 60 percent but less than 80 percent impaired, limited or restricted Swallow Discharge Status [phone]): At least 60 percent but less than 80 percent impaired, limited or restricted   Houston Siren 11/28/2016,10:48 AM  Orbie Pyo Colvin Caroli.[employer] [phone]

## 2016-11-28 NOTE — Evaluation (Signed)
Physical Therapy Evaluation Patient Details Name: Franklin Foster MRN: [id number] DOB: 09/08/1919 Today's Date: 11/28/2016   History of Present Illness  83 y.o. male with medical history significant of colon CA, HTN, dementia admitted for fall and ataxia. CT of head negative. patient is from ALF-  Clinical Impression  The patient is very pleasant, knows that he fell, but not where he was at the time. No family present for information regarding prior level of function. Patient is from ALF. Pt admitted with above diagnosis. Pt currently with functional limitations due to the deficits listed below (see PT Problem List). Pt will benefit from skilled PT to increase their independence and safety with mobility to allow discharge to the venue listed below.       Follow Up Recommendations Home health PT;SNF;Supervision/Assistance - 24 hour    Equipment Recommendations  None recommended by PT    Recommendations for Other Services       Precautions / Restrictions Precautions Precautions: Fall Restrictions Weight Bearing Restrictions: No      Mobility  Bed Mobility Overal bed mobility: Needs Assistance Bed Mobility: Supine to Sit     Supine to sit: Supervision     General bed mobility comments: not assistance required  Transfers Overall transfer level: Needs assistance Equipment used: Rolling walker (2 wheeled) Transfers: Sit to/from Stand Sit to Stand: Min guard         General transfer comment: able to stand up from bed, close min guard.  Ambulation/Gait Ambulation/Gait assistance: Min assist Ambulation Distance (Feet): 130 Feet Assistive device: Rolling walker (2 wheeled) Gait Pattern/deviations: Step-to pattern;Step-through pattern;Steppage     General Gait Details: Able to trurn around, back up to [facility] cues to reach back and sit down . Left steppage gait-patient does not know if this is normal.  Stairs            Wheelchair Mobility    Modified Rankin (Stroke  Patients Only)       Balance Overall balance assessment: History of Falls;Needs assistance Sitting-balance support: Feet supported;No upper extremity supported Sitting balance-Leahy Scale: Good     Standing balance support: During functional activity;Bilateral upper extremity supported Standing balance-Leahy Scale: Poor                               Pertinent Vitals/Pain Pain Assessment: Faces Faces Pain Scale: Hurts little more Pain Location: back Pain Descriptors / Indicators: Discomfort Pain Intervention(s): Monitored during session    Home Living Family/patient expects to be discharged to:: Assisted living                 Additional Comments: unsure of  patient's  need for ambualtory assistance required and need for RW. Pt. unable to  give info    Prior Function                 Hand Dominance        Extremity/Trunk Assessment   Upper Extremity Assessment Upper Extremity Assessment: Generalized weakness    Lower Extremity Assessment Lower Extremity Assessment: Generalized weakness;LLE deficits/detail LLE Deficits / Details: notted foot drop with steppage gait.    Cervical / Trunk Assessment Cervical / Trunk Assessment: Kyphotic  Communication   Communication:  (speech is slurred.)  Cognition Arousal/Alertness: Awake/alert Behavior During Therapy: WFL for tasks assessed/performed Overall Cognitive Status: No family/caregiver present to determine baseline cognitive functioning Area of Impairment: Orientation  Orientation Level: Place;Time             General Comments: knows he fell, does not know where he is. reports that his daughter works in [employer] and that he is from [place].      General Comments      Exercises     Assessment/Plan    PT Assessment Patient needs continued PT services  PT Problem List Decreased strength;Decreased activity tolerance;Decreased balance;Decreased  mobility;Decreased knowledge of precautions;Decreased safety awareness;Decreased knowledge of use of DME       PT Treatment Interventions DME instruction;Gait training;Functional mobility training;Therapeutic activities;Therapeutic exercise;Cognitive remediation;Patient/family education    PT Goals (Current goals can be found in the Care Plan section)  Acute Rehab PT Goals Patient Stated Goal: I want to know whre I was. PT Goal Formulation: Patient unable to participate in goal setting Time For Goal Achievement: 12/12/16 Potential to Achieve Goals: Good    Frequency Min 3X/week   Barriers to discharge        Co-evaluation               End of Session Equipment Utilized During Treatment: Gait belt Activity Tolerance: Patient tolerated treatment well Patient left: in chair;with nursing/sitter in room (going for MBS w/ SP) Nurse Communication: Mobility status PT Visit Diagnosis: Unsteadiness on feet (R26.81);History of falling (Z91.81)    Time: 3734-2876 PT Time Calculation (min) (ACUTE ONLY): 17 min   Charges:   PT Evaluation $PT Eval Low Complexity: 1 Procedure     PT G Codes:   PT G-Codes **NOT FOR INPATIENT CLASS** Functional Assessment Tool Used: AM-PAC 6 Clicks Basic Mobility;Clinical judgement Functional Limitation: Mobility: Walking and moving around Mobility: Walking and Moving Around Current Status (O1157): At least 20 percent but less than 40 percent impaired, limited or restricted Mobility: Walking and Moving Around Goal Status [phone]): At least 1 percent but less than 20 percent impaired, limited or restricted    [facility] PT [phone] }  Claretha Cooper 11/28/2016, 11:06 AM

## 2016-11-29 DIAGNOSIS — R1310 Dysphagia, unspecified: Secondary | ICD-10-CM | POA: Diagnosis not present

## 2016-11-29 DIAGNOSIS — W19XXXA Unspecified fall, initial encounter: Secondary | ICD-10-CM

## 2016-11-29 DIAGNOSIS — E860 Dehydration: Secondary | ICD-10-CM | POA: Diagnosis not present

## 2016-11-29 DIAGNOSIS — I10 Essential (primary) hypertension: Secondary | ICD-10-CM | POA: Diagnosis not present

## 2016-11-29 LAB — HEMOGLOBIN A1C
Hgb A1c MFr Bld: 5.4 % (ref 4.8–5.6)
MEAN PLASMA GLUCOSE: 108 mg/dL

## 2016-11-29 NOTE — Care Management Note (Signed)
Case Management Note  Patient Details  Name: Franklin Foster MRN: [id number] Date of Birth: 18-May-1920  Subjective/Objective:   Dehydration, syncope vs fall, HTN                 Action/Plan: Discharge Planning: NCM spoke to pt and gave permission to speak to dtr, [place] Plan [phone]. Pt lives at [place] but is needing more supervision. Dtr is requesting [facility]F. CSW referral for ALF. Pt does not qualify for SNF under his Medicare benefits. Pt does have full VA benefits. Faxed H&P to [facility].   PCP Thayer [place]  Expected Discharge Date:  11/29/2016             Expected Discharge Plan:  Assisted Living / Rest Home  In-House Referral:  Clinical Social Work  Discharge planning Services  CM Consult  Post Acute Care Choice:  NA Choice offered to:  NA  DME Arranged:  N/A DME Agency:  NA  HH Arranged:  NA HH Agency:  NA  Status of Service:  Completed, signed off  If discussed at [employer] of Stay Meetings, dates discussed:    Additional Comments:  Erenest Rasher, RN 11/29/2016, 10:35 AM

## 2016-11-29 NOTE — Progress Notes (Signed)
Report called to [facility] I spoke with the patient about this. Spoke to [employer] No Known Allergies belongings given .Awaiting transport.

## 2016-11-29 NOTE — Discharge Summary (Signed)
Physician Discharge Summary  Ivann Trimarco YWV:[id number] DOB: [birth year] DOA: 11/27/2016  PCP: [place] date: 11/27/2016 Discharge date: 11/29/2016  Admitted From: ALF [facility]) Disposition: ALF [facility])   Recommendations for Outpatient Follow-up:  1. Follow up with PCP in 1-2 weeks 2. Please obtain BMP/CBC in one week 3. Please follow up on the following pending results:  Home Health: PT Equipment/Devices: None Discharge Condition: Stable CODE STATUS: Full Diet recommendation: Dysphagia 3 (see below)  Brief/Interim Summary: Dakarri Kessinger is a 83 y.o. male with a history of colon CA, dementia and HTN who presented from [facility] assisted living after falling backward while pushing his wife in a wheelchair. He is unable to provide history due to dementia and his only complaint was pain in shoulders and the back of the head. He denied LOC. He has had a few falls in the past. On presentation he was in no distress, vitals within normal limits, oriented only to person, with bruising to the occipital scalp without laceration. CT head showed no bleeding or acute stroke and XR of bilateral shoulders were negative. Neurology was consulted and recommended MRI and admission. Subsequent MRI showed no acute bleeding, mass, or infarct. Physical therapy has recommended 24-hour supervision, either at ALF with physical therapy or SNF. He does not require SNF level of care at this time, so will be discharged with physical therapy at ALF in stable condition.  Discharge Diagnoses:  Active Problems:   Syncope   Dehydration   Essential hypertension   Ataxia  Fall: No LOC, but otherwise unknown mechanism. CT head negative. MRI negative. - No telemetry events to date to support Dx cardiogenic syncope. - PT recommends 24-hr supervision and pt seems impulsive and unsteady, very high risk for falling. I've discussed this with the patient's daughter who states he stays  with his wife who requires significant staff support and he would be supervised satisfactorily. He would not currently qualify for SNF placement. Therefore, will DC back to ALF.  Dysphagia: - SLP Diet Recommendations: Dysphagia 3 (Mech soft) solids, Nectar thick liquid with cup, no straw. Crush medications with puree. Pt able to feed self at slow rate with small sips/bites.  - Discussed with daughter that this is recommendations based on MBS study. If he does not tolerate this, it is ok to return to normal diet with the understood risks of aspiration. He currently demonstrates no evidence of aspiration pneumonia/pneumonitis.  - Recommend ongoing speech therapy after discharge.  Essential HTN: Normotensive holding BP medications, beginning to rise but not severely. Plan to restart BP meds on discharge.  Discharge Instructions  Allergies as of 11/29/2016   No Known Allergies     Medication List    TAKE these medications   acetaminophen 500 MG tablet Commonly known as:  TYLENOL Take 1 tablet (500 mg total) by mouth every 6 (six) hours as needed for moderate pain.   albuterol 108 (90 Base) MCG/ACT inhaler Commonly known as:  PROVENTIL HFA;VENTOLIN HFA Inhale 1 puff into the lungs every 4 (four) hours as needed for wheezing or shortness of breath.   EYE DROPS OP Apply 1 drop to eye 4 (four) times daily. "Artificial Saliva Solution"   ipratropium-albuterol 0.5-2.5 (3) MG/3ML Soln Commonly known as:  DUONEB Take 3 mLs by nebulization 2 (two) times daily.   lisinopril-hydrochlorothiazide 10-12.5 MG tablet Commonly known as:  PRINZIDE,ZESTORETIC Take 1.5 tablets by mouth daily.   omeprazole 20 MG capsule Commonly known as:  PRILOSEC Take 20  mg by mouth daily.   THEREMS-M PO Take 1 tablet by mouth daily.       No Known Allergies  Consultations:  Neurology by phone only  Procedures/Studies: Dg Chest 2 View  Result Date: 11/27/2016 CLINICAL DATA:  Fall with back and  shoulder pain. Initial encounter. EXAM: CHEST  2 VIEW COMPARISON:  None. FINDINGS: The cardiomediastinal silhouette is unremarkable. Mild interstitial prominence noted. There is no evidence of focal airspace disease, pulmonary edema, suspicious pulmonary nodule/mass, pleural effusion, or pneumothorax. No acute bony abnormalities are identified. IMPRESSION: No active cardiopulmonary disease. Electronically Signed   By: Margarette Canada M.D.   On: 11/27/2016 15:40   Dg Shoulder Right  Result Date: 11/27/2016 CLINICAL DATA:  Acute right shoulder pain following fall today. Initial encounter. EXAM: RIGHT SHOULDER - 2+ VIEW COMPARISON:  None. FINDINGS: There is no evidence of fracture or dislocation. There is no evidence of arthropathy or other focal bone abnormality. Soft tissues are unremarkable. IMPRESSION: Negative. Electronically Signed   By: Margarette Canada M.D.   On: 11/27/2016 15:41   Ct Head Wo Contrast  Result Date: 11/27/2016 CLINICAL DATA:  83 year old male with head and neck injury following fall 2 days ago. Initial encounter. EXAM: CT HEAD WITHOUT CONTRAST CT CERVICAL SPINE WITHOUT CONTRAST TECHNIQUE: Multidetector CT imaging of the head and cervical spine was performed following the standard protocol without intravenous contrast. Multiplanar CT image reconstructions of the cervical spine were also generated. COMPARISON:  03/14/2016 cervical spine radiographs FINDINGS: CT HEAD FINDINGS Brain: No evidence of acute infarction, hemorrhage, hydrocephalus, extra-axial collection or mass lesion/mass effect. Atrophy, chronic small-vessel white matter ischemic changes and remote bilateral basal ganglia infarcts noted. Vascular: Intracranial atherosclerotic calcifications noted. Skull: No acute abnormality.  Right frontoparietal craniotomy noted. Sinuses/Orbits: Opacified right maxillary sinus noted. No other significant abnormalities. Other: Posterior scalp soft tissue swelling identified. CT CERVICAL SPINE FINDINGS  Alignment: Reversal of the normal cervical lordosis noted. Skull base and vertebrae: No acute fracture. No primary bone lesion or focal pathologic process. Soft tissues and spinal canal: No prevertebral fluid or swelling. No visible canal hematoma. Disc levels: Mild to moderate multilevel degenerative disc disease, spondylosis and facet arthropathy throughout the cervical spine noted, greatest from C3-C7. Upper chest: No acute abnormality Other: None IMPRESSION: No evidence of acute intracranial abnormality. Atrophy, chronic small-vessel white matter ischemic changes and remote basal ganglia infarcts. Posterior scalp soft tissue swelling without fracture. No static evidence of acute injury to the cervical spine. Degenerative changes as described. Electronically Signed   By: Margarette Canada M.D.   On: 11/27/2016 15:53   Ct Cervical Spine Wo Contrast  Result Date: 11/27/2016 CLINICAL DATA:  83 year old male with head and neck injury following fall 2 days ago. Initial encounter. EXAM: CT HEAD WITHOUT CONTRAST CT CERVICAL SPINE WITHOUT CONTRAST TECHNIQUE: Multidetector CT imaging of the head and cervical spine was performed following the standard protocol without intravenous contrast. Multiplanar CT image reconstructions of the cervical spine were also generated. COMPARISON:  03/14/2016 cervical spine radiographs FINDINGS: CT HEAD FINDINGS Brain: No evidence of acute infarction, hemorrhage, hydrocephalus, extra-axial collection or mass lesion/mass effect. Atrophy, chronic small-vessel white matter ischemic changes and remote bilateral basal ganglia infarcts noted. Vascular: Intracranial atherosclerotic calcifications noted. Skull: No acute abnormality.  Right frontoparietal craniotomy noted. Sinuses/Orbits: Opacified right maxillary sinus noted. No other significant abnormalities. Other: Posterior scalp soft tissue swelling identified. CT CERVICAL SPINE FINDINGS Alignment: Reversal of the normal cervical lordosis noted.  Skull base and vertebrae: No acute fracture. No  primary bone lesion or focal pathologic process. Soft tissues and spinal canal: No prevertebral fluid or swelling. No visible canal hematoma. Disc levels: Mild to moderate multilevel degenerative disc disease, spondylosis and facet arthropathy throughout the cervical spine noted, greatest from C3-C7. Upper chest: No acute abnormality Other: None IMPRESSION: No evidence of acute intracranial abnormality. Atrophy, chronic small-vessel white matter ischemic changes and remote basal ganglia infarcts. Posterior scalp soft tissue swelling without fracture. No static evidence of acute injury to the cervical spine. Degenerative changes as described. Electronically Signed   By: Margarette Canada M.D.   On: 11/27/2016 15:53   Mr Brain Wo Contrast  Result Date: 11/28/2016 CLINICAL DATA:  Slurred speech.  History of colon cancer. EXAM: MRI HEAD WITHOUT CONTRAST MRA HEAD WITHOUT CONTRAST TECHNIQUE: Multiplanar, multiecho pulse sequences of the brain and surrounding structures were obtained without intravenous contrast. Angiographic images of the head were obtained using MRA technique without contrast. COMPARISON:  Head CT 11/27/2016 FINDINGS: MRI HEAD FINDINGS Brain: No focal diffusion restriction to indicate acute infarct. No intraparenchymal hemorrhage. There is diffuse cerebral atrophy. There is beginning confluent hyperintense T2-weighted signal within the periventricular white matter, most often seen in the setting of chronic microvascular ischemia. No mass lesion or midline shift. No hydrocephalus or extra-axial fluid collection. The midline structures are normal. Skull and upper cervical spine: The visualized skull base, calvarium, upper cervical spine and extracranial soft tissues are normal. Sinuses/Orbits: Complete opacification of the right maxillary sinus. No mastoid effusion. Normal orbits. MRA HEAD FINDINGS Intracranial internal carotid arteries: Normal. Anterior  cerebral arteries: Normal. Middle cerebral arteries: Normal. Posterior communicating arteries: Absent bilaterally Posterior cerebral arteries: Normal. Basilar artery: Normal. Vertebral arteries: Codominant. Normal. Superior cerebellar arteries: Normal. Anterior inferior cerebellar arteries: Normal on the right. Not clearly visualized on the left. Posterior inferior cerebellar arteries: Normal on the right. Not clearly visualized on the left. IMPRESSION: 1. No acute intracranial abnormality. 2. Diffuse cerebral atrophy and chronic microvascular ischemia. 3. No occlusion or high-grade stenosis of the arteries of the circle of Willis. Electronically Signed   By: Ulyses Jarred M.D.   On: 11/28/2016 17:07   Dg Shoulder Left  Result Date: 11/27/2016 CLINICAL DATA:  Acute left shoulder pain following fall today. Initial encounter. EXAM: LEFT SHOULDER - 2+ VIEW COMPARISON:  None. FINDINGS: There is no evidence of fracture or dislocation. There is no evidence of arthropathy or other focal bone abnormality. Soft tissues are unremarkable. IMPRESSION: Negative. Electronically Signed   By: Margarette Canada M.D.   On: 11/27/2016 15:42   Dg Swallowing Func-speech Pathology  Result Date: 11/28/2016 Objective Swallowing Evaluation: Type of Study: MBS-Modified Barium Swallow Study Patient Details Name: Nikholas Geffre MRN: [id number] Date of Birth: [birth year] Today's Date: 11/28/2016 Time: SLP Start Time (ACUTE ONLY): 1058-SLP Stop Time (ACUTE ONLY): 1115 SLP Time Calculation (min) (ACUTE ONLY): 17 min Past Medical History: Past Medical History: Diagnosis Date . Colon cancer ([place])  . Depressed  . GERD (gastroesophageal reflux disease)  . Hypertension  . Spinal stenosis  Past Surgical History: Past Surgical History: Procedure Laterality Date . CHOLECYSTECTOMY   . TONSILLECTOMY   HPI: Gerrit Rafalski a 83 y.o.malewith medical history significant of GERD, colon CA, HTN, dementia. CT no evidence of acute intracranial abnormality. Atrophy,  chronic small-vessel white matter ischemic changes and remote basal ganglia No Data Recorded Assessment / Plan / Recommendation CHL IP CLINICAL IMPRESSIONS 11/28/2016 Clinical Impression Pt presents with a mild oral dysphagia with piecemeal transit and lingual residue that pt senses and  clears with second swallow. Mild pharyngeal dysphagia with flash penetration as well as one aspiration episode of thin before (delayed weak ineffective cough) the swallow due to decreased oral cohesion and control and premature spill with thin. No airway compromise with one thin trial performing chin tuck although he cannot perform independently due to dementia/obvious memory deficits. Pt has more patent hypopharyngeal space due to cervical kyphosis which may contribute to decreased airway closure. Mild vallecular and pyriform sinsus residue cleared with reflexive swallows. Important to note that pt produced reflexive cough multiple times throughout MBS without penetration or aspiration observed. MBS does not diagnose below level of the UES however esophageal scan performed without significant observations. UES appeared intermittently tight. Dr. Bonner Puna present on floor and discussed options of continuing thin liquid given age, one instance of aspiration, ambulatory, no recurrent pna versus short trial of thickener while he is medically compromised with return to thin in near future. Agreed upon for trial of thickener and MD will speak with family re: wishes for thin liquid. Prognosis is good for returning to thin. Recomemnd Speech therapy at ALF, Dys 3 texture, nectar thick liquids, no straws, crush meds.       SLP Visit Diagnosis Dysphagia, oropharyngeal phase (R13.12) Attention and concentration deficit following -- Frontal lobe and executive function deficit following -- Impact on safety and function Moderate aspiration risk   CHL IP TREATMENT RECOMMENDATION 11/28/2016 Treatment Recommendations Therapy as outlined in treatment plan  below   Prognosis 11/28/2016 Prognosis for Safe Diet Advancement Good Barriers to Reach Goals -- Barriers/Prognosis Comment -- CHL IP DIET RECOMMENDATION 11/28/2016 SLP Diet Recommendations Dysphagia 3 (Mech soft) solids;Nectar thick liquid Liquid Administration via Cup;No straw Medication Administration Crushed with puree Compensations Slow rate;Small sips/bites Postural Changes --   CHL IP OTHER RECOMMENDATIONS 11/28/2016 Recommended Consults -- Oral Care Recommendations Oral care BID Other Recommendations --   CHL IP FOLLOW UP RECOMMENDATIONS 11/28/2016 Follow up Recommendations 24 hour supervision/assistance   CHL IP FREQUENCY AND DURATION 11/28/2016 Speech Therapy Frequency (ACUTE ONLY) min 1 x/week Treatment Duration 1 week      CHL IP ORAL PHASE 11/28/2016 Oral Phase Impaired Oral - Pudding Teaspoon -- Oral - Pudding Cup -- Oral - Honey Teaspoon -- Oral - Honey Cup -- Oral - Nectar Teaspoon -- Oral - Nectar Cup Piecemeal swallowing;Lingual/palatal residue Oral - Nectar Straw -- Oral - Thin Teaspoon NT Oral - Thin Cup Piecemeal swallowing;Lingual/palatal residue;Premature spillage Oral - Thin Straw NT Oral - Puree -- Oral - Mech Soft -- Oral - Regular -- Oral - Multi-Consistency -- Oral - Pill -- Oral Phase - Comment --  CHL IP PHARYNGEAL PHASE 11/28/2016 Pharyngeal Phase Impaired Pharyngeal- Pudding Teaspoon -- Pharyngeal -- Pharyngeal- Pudding Cup -- Pharyngeal -- Pharyngeal- Honey Teaspoon -- Pharyngeal -- Pharyngeal- Honey Cup -- Pharyngeal -- Pharyngeal- Nectar Teaspoon -- Pharyngeal -- Pharyngeal- Nectar Cup Pharyngeal residue - valleculae;Pharyngeal residue - pyriform;Reduced laryngeal elevation;Reduced epiglottic inversion Pharyngeal -- Pharyngeal- Nectar Straw -- Pharyngeal -- Pharyngeal- Thin Teaspoon -- Pharyngeal -- Pharyngeal- Thin Cup Penetration/Aspiration before swallow;Penetration/Aspiration during swallow;Reduced airway/laryngeal closure;Pharyngeal residue - valleculae;Pharyngeal residue -  pyriform;Reduced epiglottic inversion Pharyngeal Material enters airway, passes BELOW cords and not ejected out despite cough attempt by patient;Material enters airway, remains ABOVE vocal cords then ejected out Pharyngeal- Thin Straw -- Pharyngeal -- Pharyngeal- Puree WFL Pharyngeal -- Pharyngeal- Mechanical Soft -- Pharyngeal -- Pharyngeal- Regular Delayed swallow initiation-vallecula Pharyngeal -- Pharyngeal- Multi-consistency -- Pharyngeal -- Pharyngeal- Pill -- Pharyngeal -- Pharyngeal Comment --  CHL IP CERVICAL  ESOPHAGEAL PHASE 11/28/2016 Cervical Esophageal Phase Impaired Pudding Teaspoon -- Pudding Cup -- Honey Teaspoon -- Honey Cup -- Nectar Teaspoon -- Nectar Cup -- Nectar Straw -- Thin Teaspoon -- Thin Cup -- Thin Straw -- Puree -- Mechanical Soft -- Regular -- Multi-consistency -- Pill -- Cervical Esophageal Comment -- CHL IP GO 11/28/2016 Functional Assessment Tool Used skilled clinical judgement Functional Limitations Swallowing Swallow Current Status (G6269) CK Swallow Goal Status (S8546) CK Swallow Discharge Status (E7035) CK Motor Speech Current Status (K0938) (None) Motor Speech Goal Status (H8299) (None) Motor Speech Goal Status (B7169) (None) Spoken Language Comprehension Current Status (C7893) (None) Spoken Language Comprehension Goal Status (Y1017) (None) Spoken Language Comprehension Discharge Status (P1025) (None) Spoken Language Expression Current Status (E5277) (None) Spoken Language Expression Goal Status (O2423) (None) Spoken Language Expression Discharge Status [phone]) (None) Attention Current Status (E3154) (None) Attention Goal Status (M0867) (None) Attention Discharge Status (Y1950) (None) Memory Current Status (D3267) (None) Memory Goal Status (T2458) (None) Memory Discharge Status (K9983) (None) Voice Current Status (J8250) (None) Voice Goal Status (N3976) (None) Voice Discharge Status (B3419) (None) Other Speech-Language Pathology Functional Limitation Current Status (F7902) (None)  Other Speech-Language Pathology Functional Limitation Goal Status (I0973) (None) Other Speech-Language Pathology Functional Limitation Discharge Status [phone]) (None) [place] Siren 11/28/2016, 12:02 PM Orbie Pyo Colvin Caroli.Ed CCC-SLP Pager [phone]              Mr Jodene Nam Head/brain Wo Cm  Result Date: 11/28/2016 CLINICAL DATA:  Slurred speech.  History of colon cancer. EXAM: MRI HEAD WITHOUT CONTRAST MRA HEAD WITHOUT CONTRAST TECHNIQUE: Multiplanar, multiecho pulse sequences of the brain and surrounding structures were obtained without intravenous contrast. Angiographic images of the head were obtained using MRA technique without contrast. COMPARISON:  Head CT 11/27/2016 FINDINGS: MRI HEAD FINDINGS Brain: No focal diffusion restriction to indicate acute infarct. No intraparenchymal hemorrhage. There is diffuse cerebral atrophy. There is beginning confluent hyperintense T2-weighted signal within the periventricular white matter, most often seen in the setting of chronic microvascular ischemia. No mass lesion or midline shift. No hydrocephalus or extra-axial fluid collection. The midline structures are normal. Skull and upper cervical spine: The visualized skull base, calvarium, upper cervical spine and extracranial soft tissues are normal. Sinuses/Orbits: Complete opacification of the right maxillary sinus. No mastoid effusion. Normal orbits. MRA HEAD FINDINGS Intracranial internal carotid arteries: Normal. Anterior cerebral arteries: Normal. Middle cerebral arteries: Normal. Posterior communicating arteries: Absent bilaterally Posterior cerebral arteries: Normal. Basilar artery: Normal. Vertebral arteries: Codominant. Normal. Superior cerebellar arteries: Normal. Anterior inferior cerebellar arteries: Normal on the right. Not clearly visualized on the left. Posterior inferior cerebellar arteries: Normal on the right. Not clearly visualized on the left. IMPRESSION: 1. No acute intracranial abnormality. 2.  Diffuse cerebral atrophy and chronic microvascular ischemia. 3. No occlusion or high-grade stenosis of the arteries of the circle of Willis. Electronically Signed   By: Ulyses Jarred M.D.   On: 11/28/2016 17:07   Subjective: Pt without complaints. No events overnight.   Discharge Exam: Vitals:   11/29/16 0532 11/29/16 0902  BP: (!) 144/79 134/68  Pulse: 93 84  Resp: 18 17  Temp: 97.6 F (36.4 C) 97.8 F (36.6 C)   General: Pt is alert, awake, not in acute distress Cardiovascular: RRR, S1/S2 +, no rubs, no gallops Respiratory: CTA bilaterally, no wheezing, no rhonchi Abdominal: Soft, NT, ND, bowel sounds + Neuro: Alert, oriented to person only. Dysarthria noted. CN's intact, no focal weakness x4 extremities. Diffusely weak. Skin: ~4cm diameter nonraised contusion to the  posterior scalp/inion.  Labs: Basic Metabolic Panel:  Recent Labs Lab 11/27/16 1413  NA 132*  K 4.1  CL 99*  CO2 25  GLUCOSE 102*  BUN 20  CREATININE 0.97  CALCIUM 8.8*   CBC:  Recent Labs Lab 11/27/16 1413  WBC 7.4  HGB 12.3*  HCT 35.8*  MCV 96.2  PLT 219   Cardiac Enzymes:  Recent Labs Lab 11/27/16 1413 11/28/16 0111  TROPONINI <0.03 <0.03   Lipid Profile  Recent Labs  11/28/16 0111  CHOL 109  HDL 39*  LDLCALC 59  TRIG 56  CHOLHDL 2.8   Urinalysis    Component Value Date/Time   COLORURINE YELLOW 11/27/2016 1413   APPEARANCEUR CLEAR 11/27/2016 1413   LABSPEC 1.010 11/27/2016 1413   PHURINE 5.0 11/27/2016 1413   GLUCOSEU NEGATIVE 11/27/2016 1413   [place] 11/27/2016 1413   [place] 11/27/2016 [place] 11/27/2016 1413   PROTEINUR NEGATIVE 11/27/2016 1413   NITRITE NEGATIVE 11/27/2016 [place] 11/27/2016 1413    Microbiology Recent Results (from the past 240 hour(s))  MRSA PCR Screening     Status: Abnormal   Collection Time: 11/28/16 12:10 AM  Result Value Ref Range Status   MRSA by PCR POSITIVE (A) NEGATIVE  Final    Comment:        The GeneXpert MRSA Assay (FDA approved for NASAL specimens only), is one component of a comprehensive MRSA colonization surveillance program. It is not intended to diagnose MRSA infection nor to guide or monitor treatment for MRSA infections. CRITICAL RESULT CALLED TO, READ BACK BY AND VERIFIED WITH: BLAINE,C 0300 [id number] [place],N     Time coordinating discharge: Approximately 40 minutes  Vance Gather, MD  [facility] 11/29/2016, 12:15 PM Pager [phone]

## 2016-11-29 NOTE — Progress Notes (Signed)
Occupational Therapy Evaluation Patient Details Name: Franklin Foster MRN: [id number] DOB: 06/18/1920 Today's Date: 11/29/2016    History of Present Illness 83 y.o. male with medical history significant of colon CA, HTN, dementia admitted for fall and ataxia. CT of head negative. patient is from ALF-   Clinical Impression   PTA, pt apparently lived in [place] apartment with wife. Pt unable to give accurate information and thinks his wife is sitting in the window sill. Required Max A with stand pivot transfer with RW and unable to ambulate at this time. Max A required to prevent fall. Pt will need direct physical assistance with all mobility and ADL and would benefit from rehab at [facility]. Case worker/nsg  notified of functional status today.     Follow Up Recommendations  SNF;Supervision/Assistance - 24 hour    Equipment Recommendations  None recommended by OT    Recommendations for Other Services       Precautions / Restrictions Precautions Precautions: Fall      Mobility Bed Mobility Overal bed mobility: Needs Assistance Bed Mobility: Supine to Sit     Supine to sit: Supervision        Transfers Overall transfer level: Needs assistance Equipment used: Rolling walker (2 wheeled) Transfers: Sit to/from [facility] Sit to Stand: Mod assist Stand pivot transfers: Max assist       General transfer comment: Pt falling posteriorly. High fall risk    Balance Overall balance assessment: History of Falls Sitting-balance support: Feet supported Sitting balance-Leahy Scale: Fair       Standing balance-Leahy Scale: Zero                             ADL either performed or assessed with clinical judgement   ADL Overall ADL's : Needs assistance/impaired     Grooming: Minimal assistance   Upper Body Bathing: Minimal assistance   Lower Body Bathing: Maximal assistance;Sit to/from stand   Upper Body Dressing : Minimal assistance;Sitting   Lower  Body Dressing: Total assistance;Sit to/from stand               Functional mobility during ADLs: Maximal assistance;Rolling walker;Cueing for safety;Cueing for sequencing General ADL Comments: Pt unsafe during tranfer. Pt fell backwards to bed x 3. Required Max A to stand pivot transfer to recliner. Pt jamming RW into chiar and reqrueid physical assistnace to move RW and MAx A to prevent fall.     Vision   Additional Comments: poor vision     Perception     Praxis      Pertinent Vitals/Pain Pain Assessment: No/denies pain     Hand Dominance Right   Extremity/Trunk Assessment Upper Extremity Assessment Upper Extremity Assessment: Generalized weakness   Lower Extremity Assessment Lower Extremity Assessment: Generalized weakness   Cervical / Trunk Assessment Cervical / Trunk Assessment: Kyphotic   Communication Communication Communication: Expressive difficulties;HOH (slurred speach)   Cognition                                           General Comments       Exercises     Shoulder Instructions      Home Living Family/patient expects to be discharged to:: Unsure  Prior Functioning/Environment Level of Independence: Independent with assistive device(s)        Comments: Pt unable to give accurate information. Per chart, [place] living [place]        OT Problem List: Decreased strength;Decreased range of motion;Decreased activity tolerance;Impaired balance (sitting and/or standing);Decreased cognition;Decreased safety awareness;Decreased knowledge of use of DME or AE      OT Treatment/Interventions: Self-care/ADL training;Therapeutic exercise;DME and/or AE instruction;Therapeutic activities;Cognitive remediation/compensation;Patient/family education;Balance training    OT Goals(Current goals can be found in the care plan section) Acute Rehab OT Goals Patient Stated Goal: to find his  wife OT Goal Formulation: Patient unable to participate in goal setting Time For Goal Achievement: 12/13/16 Potential to Achieve Goals: Good  OT Frequency: Min 2X/week   Barriers to D/C:            Co-evaluation              End of Session Equipment Utilized During Treatment: Gait belt;Rolling walker Nurse Communication: Mobility status  Activity Tolerance: Patient tolerated treatment well Patient left: in chair;with call bell/phone within reach;with chair alarm set  OT Visit Diagnosis: Unsteadiness on feet (R26.81);Repeated falls (R29.6);Muscle weakness (generalized) (M62.81);Cognitive communication deficit (R41.841)                Time: 6226-3335 OT Time Calculation (min): 21 min Charges:  OT General Charges $OT Visit: 1 Procedure OT Evaluation $OT Eval Moderate Complexity: 1 Procedure G-Codes:     Franklin Foster, OT/L  [phone] 11/29/2016  Franklin Foster,Franklin Foster 11/29/2016, 12:29 PM

## 2016-11-29 NOTE — Progress Notes (Signed)
Patient will discharge to [facility] Anticipated discharge date: 4/22 Family notified: dtr Transportation by ambulance- called at [place] signing off.  Jorge Ny, LCSW Clinical Social Worker [phone]

## 2016-11-29 NOTE — Care Management Obs Status (Addendum)
[place] NOTIFICATION   Patient Details  Name: Franklin Foster MRN: [id number] Date of Birth: 18-Dec-1919   Medicare Observation Status Notification Given:  Yes NCM discussed observation status with dtr, [place] Plan via phone [phone].   Erenest Rasher, RN 11/29/2016, 9:38 AM

## 2016-11-29 NOTE — NC FL2 (Signed)
  [place] LEVEL OF CARE SCREENING TOOL     IDENTIFICATION  Patient Name: Franklin Foster Birthdate: [birth year] Sex: male Admission Date (Current Location): 11/27/2016  [facility] and [place] Number:  Herbalist and Address:  [facility],  [place] [address] [place]      Provider Number: [id number]  Attending Physician Name and Address:  Patrecia Pour, MD  Relative Name and Phone Number:       Current Level of Care: Hospital Recommended Level of Care: [place] Prior Approval Number:    Date Approved/Denied:   PASRR Number:    Discharge Plan: ALF   Current Diagnoses: Patient Active Problem List   Diagnosis Date Noted  . Syncope 11/27/2016  . Dehydration 11/27/2016  . Essential hypertension 11/27/2016  . Ataxia 11/27/2016    Orientation RESPIRATION BLADDER Height & Weight        Normal Incontinent Weight: 157 lb 12.8 oz (71.6 kg) Height:  6' (182.9 cm)  BEHAVIORAL SYMPTOMS/MOOD NEUROLOGICAL BOWEL NUTRITION STATUS      Continent Diet-Dysphagia 3 (Mech soft) solids, Nectar thick liquid with cup, no straw. Crush medications with puree. If he does not tolerate this, it is ok to return to normal diet with the understood risks of aspiration.  AMBULATORY STATUS COMMUNICATION OF NEEDS Skin   Limited Assist Verbally Normal                       Personal Care Assistance Level of Assistance  Bathing, Dressing Bathing Assistance: Limited assistance   Dressing Assistance: Limited assistance     Functional Limitations Info             SPECIAL CARE FACTORS FREQUENCY  PT (By licensed PT), OT (By licensed OT)     PT Frequency: 3/wk with home health OT Frequency: 3/wk with home  health            Contractures      Additional Factors Info  Code Status, Allergies Code Status Info: FULL Allergies Info: NKA             Discharge Medications:     TAKE these medications   acetaminophen 500 MG  tablet Commonly known as:  TYLENOL Take 1 tablet (500 mg total) by mouth every 6 (six) hours as needed for moderate pain.   albuterol 108 (90 Base) MCG/ACT inhaler Commonly known as:  PROVENTIL HFA;VENTOLIN HFA Inhale 1 puff into the lungs every 4 (four) hours as needed for wheezing or shortness of breath.   EYE DROPS OP Apply 1 drop to eye 4 (four) times daily. "Artificial Saliva Solution"   ipratropium-albuterol 0.5-2.5 (3) MG/3ML Soln Commonly known as:  DUONEB Take 3 mLs by nebulization 2 (two) times daily.   lisinopril-hydrochlorothiazide 10-12.5 MG tablet Commonly known as:  PRINZIDE,ZESTORETIC Take 1.5 tablets by mouth daily.   omeprazole 20 MG capsule Commonly known as:  PRILOSEC Take 20 mg by mouth daily.   THEREMS-M PO Take 1 tablet by mouth daily.     Relevant Imaging Results:  Relevant Lab Results:   Additional Information    Jorge Ny, LCSW

## 2016-12-11 NOTE — Progress Notes (Signed)
OT Note - Addendum    11/29/16 1230  OT Visit Information  Last OT Received On 2017/01/07  OT G-codes **NOT FOR INPATIENT CLASS**  Functional Assessment Tool Used Clinical judgement  Functional Limitation Self care  Self Care Current Status [phone]) CM  Self Care Goal Status [phone]) [place], OT/L  [phone] 11/29/2016

## 2018-11-27 IMAGING — MR MR HEAD W/O CM
11 of 14 series · 36 of 48 positions shown · non-contrast
Comparison: Head CT 11/27/2016

CLINICAL DATA: Slurred speech.  History of colon cancer.

EXAM:
MRI HEAD WITHOUT CONTRAST
MRA HEAD WITHOUT CONTRAST
TECHNIQUE: Multiplanar, multiecho pulse sequences of the brain and surrounding
structures were obtained without intravenous contrast. Angiographic
images of the head were obtained using MRA technique without
contrast.

[Series 4: (id) mt fs · axial · 1.4mm · 0.39mm/px · z∈[-15,+24]mm · 4 of 140 slices shown]
[im 1/140]
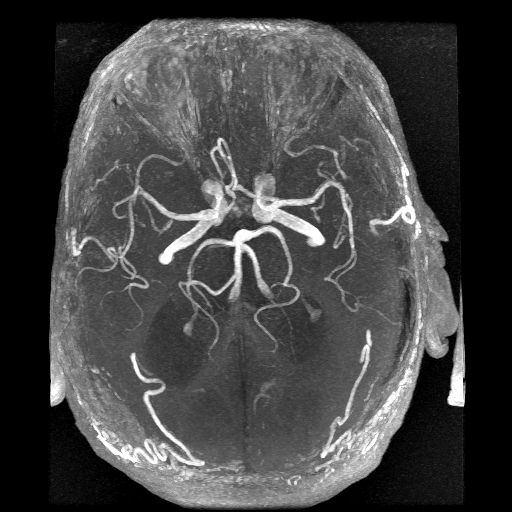
[im 18/140]
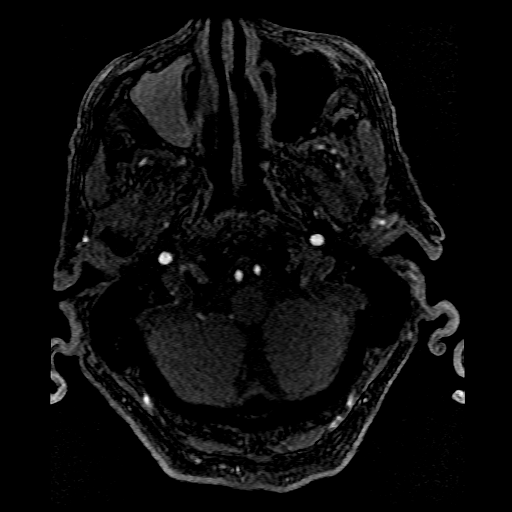
[im 35/140]
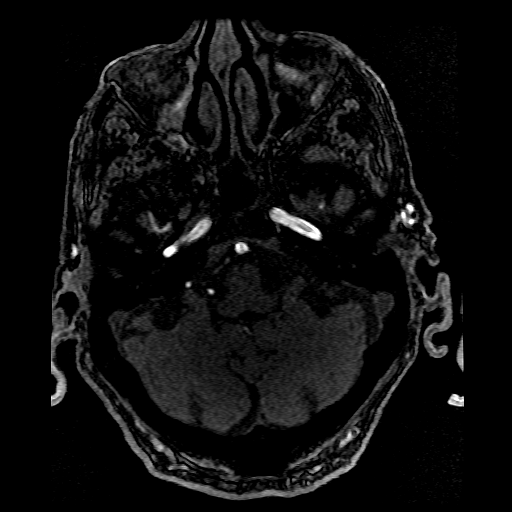
[im 53/140]
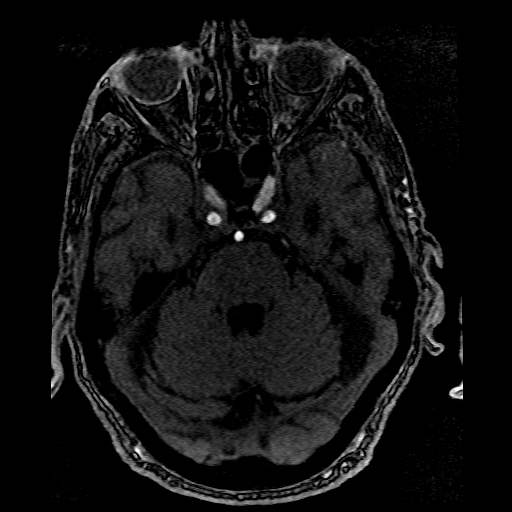

[Series 5: T1 · sagittal · 5.0mm · 0.47mm/px · 1 of 24 slices shown]
[im 1/24]
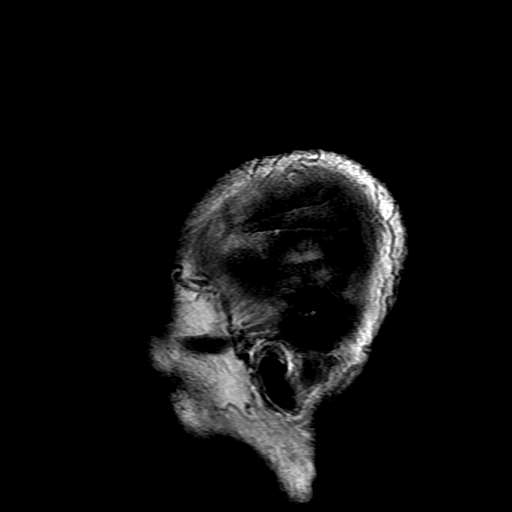

[Series 6: DWI · axial · 3.0mm · 1.09mm/px · z∈[-44,+114]mm · 6 of 108 slices shown (1 of 6)]
[im 1/108]
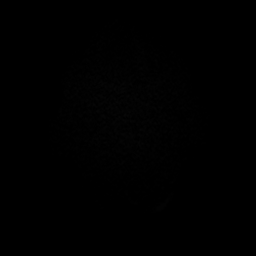
[im 22/108]
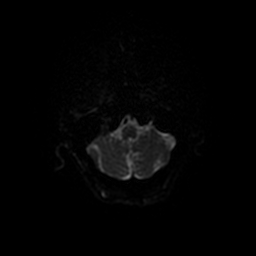
[im 43/108]
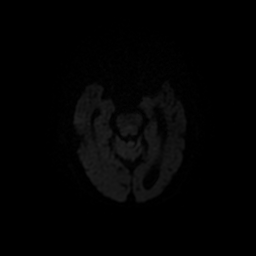
[im 65/108]
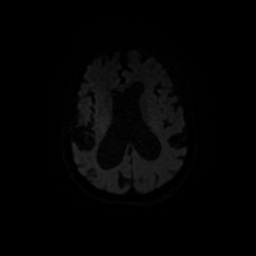
[im 86/108]
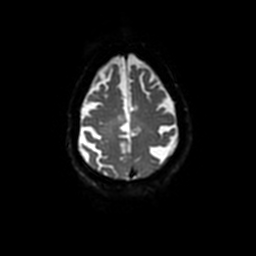
[im 108/108]
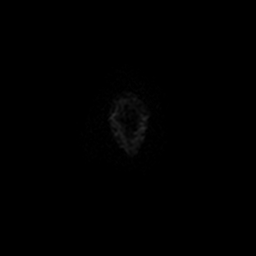

[Series 7: DWI · coronal · 5.0mm · 1.09mm/px · 5 of 73 slices shown (2 of 6)]
[im 1/73]
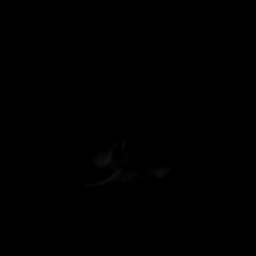
[im 19/73]
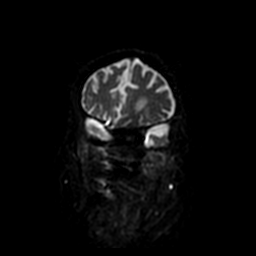
[im 37/73]
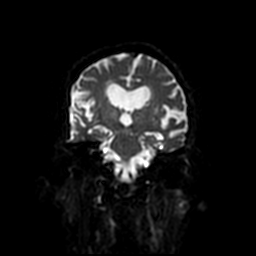
[im 55/73]
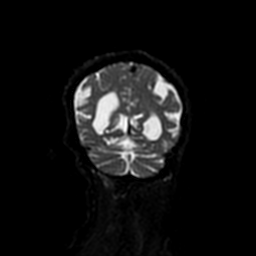
[im 73/73]
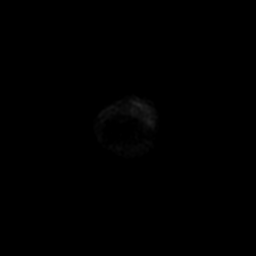

[Series 8: T2 · axial · 5.0mm · 0.43mm/px · z∈[-46,+109]mm · 2 of 27 slices shown]
[im 1/27]
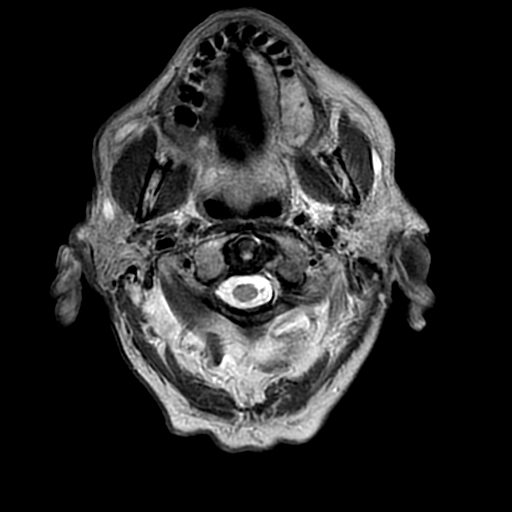
[im 27/27]
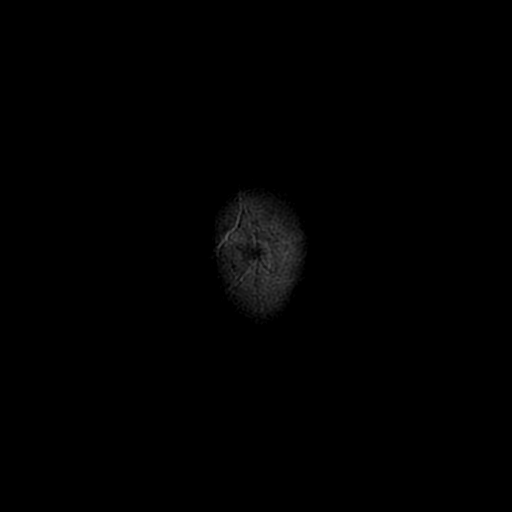

[Series 9: FLAIR · axial · 5.0mm · 0.43mm/px · z∈[-46,+109]mm · 2 of 27 slices shown]
[im 1/27]
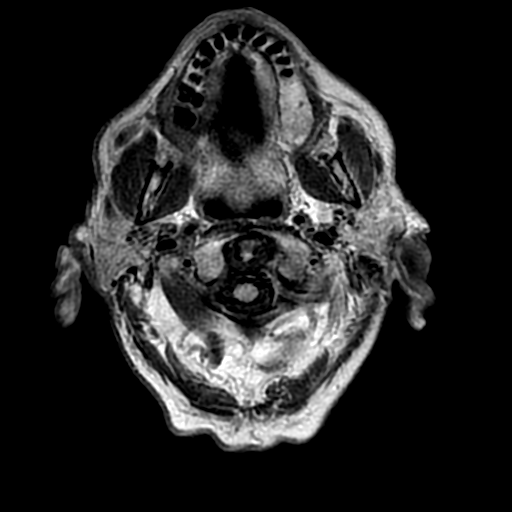
[im 27/27]
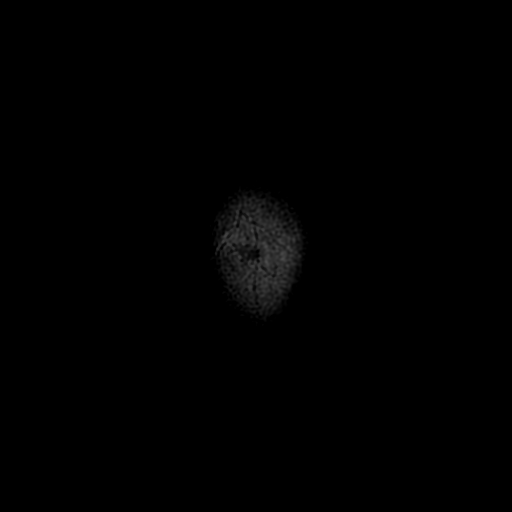

[Series 12: T2 post-contrast · coronal · 5.0mm · 0.45mm/px · 2 of 30 slices shown]
[im 1/30]
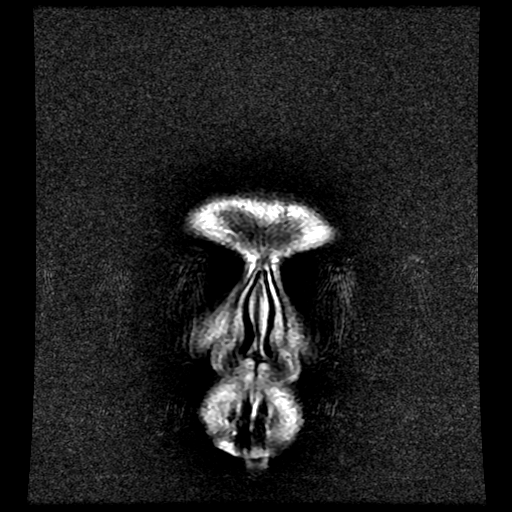
[im 30/30]
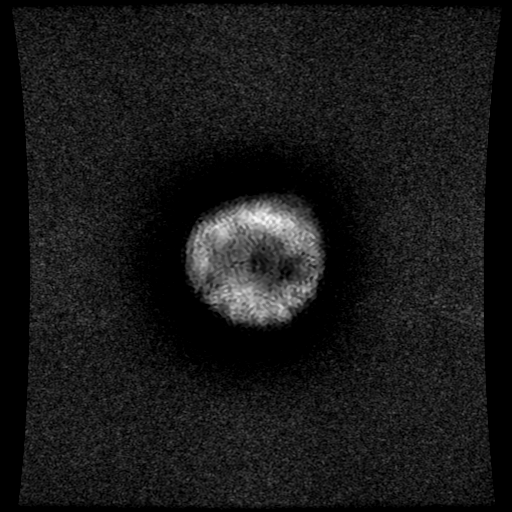

[Series 600: DWI · axial · 3.0mm · 1.09mm/px · z∈[-44,+114]mm · 4 of 54 slices shown (3 of 6)]
[im 1/54]
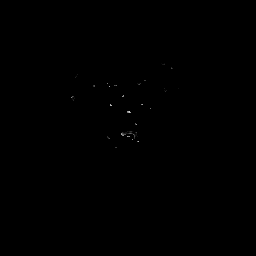
[im 18/54]
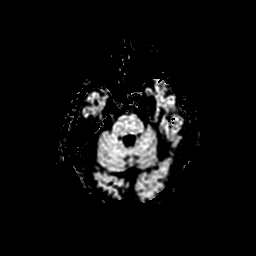
[im 36/54]
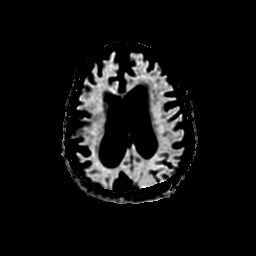
[im 54/54]
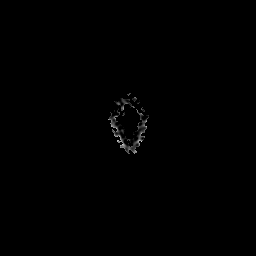

[Series 601: DWI · axial · 3.0mm · 1.09mm/px · z∈[-44,+114]mm · 4 of 54 slices shown (4 of 6)]
[im 1/54]
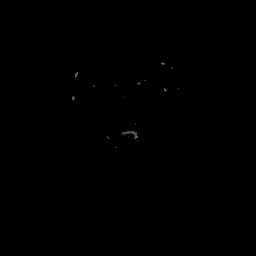
[im 18/54]
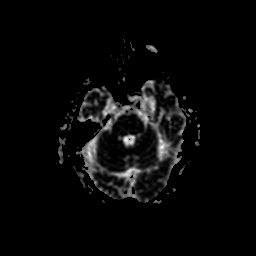
[im 36/54]
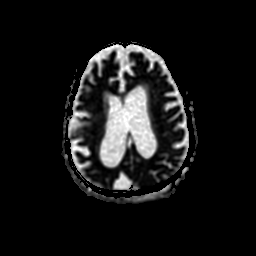
[im 54/54]
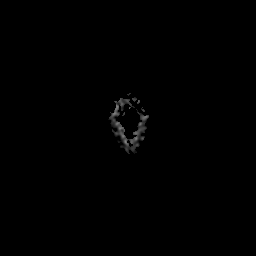

[Series 700: DWI · coronal · 5.0mm · 1.09mm/px · 3 of 37 slices shown (5 of 6)]
[im 1/37]
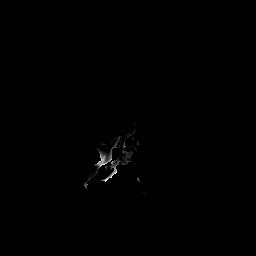
[im 19/37]
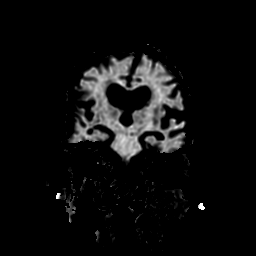
[im 37/37]
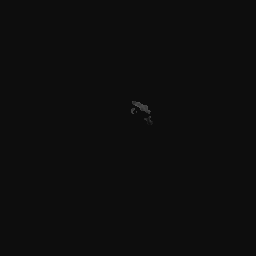

[Series 701: DWI · coronal · 5.0mm · 1.09mm/px · 3 of 37 slices shown (6 of 6)]
[im 1/37]
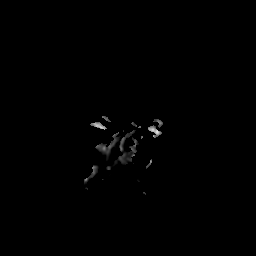
[im 19/37]
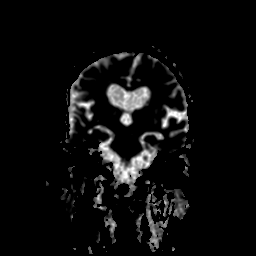
[im 37/37]
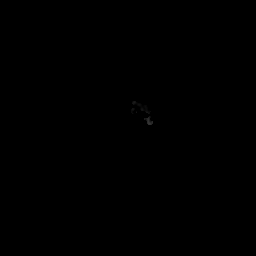

[36 of 48 positions shown; findings below may reference images not displayed]

FINDINGS: MRI HEAD FINDINGS

Brain: No focal diffusion restriction to indicate acute infarct. No
intraparenchymal hemorrhage. There is diffuse cerebral atrophy.
There is beginning confluent hyperintense T2-weighted signal within
the periventricular white matter, most often seen in the setting of
chronic microvascular ischemia. No mass lesion or midline shift. No
hydrocephalus or extra-axial fluid collection. The midline
structures are normal.

Skull and upper cervical spine: The visualized skull base,
calvarium, upper cervical spine and extracranial soft tissues are
normal.

Sinuses/Orbits: Complete opacification of the right maxillary sinus.
No mastoid effusion. Normal orbits.

MRA HEAD FINDINGS

Intracranial internal carotid arteries: Normal.

Anterior cerebral arteries: Normal.

Middle cerebral arteries: Normal.

Posterior communicating arteries: Absent bilaterally

Posterior cerebral arteries: Normal.

Basilar artery: Normal.

Vertebral arteries: Codominant. Normal.

Superior cerebellar arteries: Normal.

Anterior inferior cerebellar arteries: Normal on the right. Not
clearly visualized on the left.

Posterior inferior cerebellar arteries: Normal on the right. Not
clearly visualized on the left.
IMPRESSION: 1. No acute intracranial abnormality.
2. Diffuse cerebral atrophy and chronic microvascular ischemia.
3. No occlusion or high-grade stenosis of the arteries of the circle
of Willis.

## 2019-09-11 DEATH — deceased
